# Patient Record
Sex: Female | Born: 1985 | Race: Black or African American | Hispanic: No | Marital: Single | Smoking: Former smoker
Health system: Southern US, Community
[De-identification: ages and names within clinical notes are randomized; demographics above are authoritative.]

## PROBLEM LIST (undated history)

## (undated) DIAGNOSIS — A599 Trichomoniasis, unspecified: Secondary | ICD-10-CM

## (undated) DIAGNOSIS — I10 Essential (primary) hypertension: Secondary | ICD-10-CM

## (undated) DIAGNOSIS — A5400 Gonococcal infection of lower genitourinary tract, unspecified: Secondary | ICD-10-CM

## (undated) DIAGNOSIS — IMO0002 Reserved for concepts with insufficient information to code with codable children: Secondary | ICD-10-CM

## (undated) DIAGNOSIS — N72 Inflammatory disease of cervix uteri: Secondary | ICD-10-CM

## (undated) HISTORY — DX: Reserved for concepts with insufficient information to code with codable children: IMO0002

## (undated) HISTORY — DX: Gonococcal infection of lower genitourinary tract, unspecified: A54.00

## (undated) HISTORY — DX: Inflammatory disease of cervix uteri: N72

---
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
## 2003-02-19 ENCOUNTER — Encounter: Admission: RE | Admit: 2003-02-19 | Discharge: 2003-02-19 | Payer: Self-pay | Admitting: Family Medicine

## 2003-02-19 ENCOUNTER — Ambulatory Visit (HOSPITAL_COMMUNITY): Admission: RE | Admit: 2003-02-19 | Discharge: 2003-02-19 | Payer: Self-pay | Admitting: Sports Medicine

## 2003-03-05 ENCOUNTER — Encounter: Payer: Self-pay | Admitting: Family Medicine

## 2003-03-05 ENCOUNTER — Encounter: Admission: RE | Admit: 2003-03-05 | Discharge: 2003-03-05 | Payer: Self-pay | Admitting: Family Medicine

## 2003-03-05 ENCOUNTER — Other Ambulatory Visit: Admission: RE | Admit: 2003-03-05 | Discharge: 2003-03-05 | Payer: Self-pay | Admitting: Family Medicine

## 2003-06-23 ENCOUNTER — Encounter: Admission: RE | Admit: 2003-06-23 | Discharge: 2003-06-23 | Payer: Self-pay | Admitting: Sports Medicine

## 2003-06-23 ENCOUNTER — Ambulatory Visit (HOSPITAL_COMMUNITY): Admission: RE | Admit: 2003-06-23 | Discharge: 2003-06-23 | Payer: Self-pay | Admitting: Sports Medicine

## 2003-09-10 ENCOUNTER — Encounter: Admission: RE | Admit: 2003-09-10 | Discharge: 2003-09-10 | Payer: Self-pay | Admitting: Family Medicine

## 2003-09-25 ENCOUNTER — Encounter: Admission: RE | Admit: 2003-09-25 | Discharge: 2003-09-25 | Payer: Self-pay | Admitting: Family Medicine

## 2003-11-28 ENCOUNTER — Emergency Department (HOSPITAL_COMMUNITY): Admission: AD | Admit: 2003-11-28 | Discharge: 2003-11-28 | Payer: Self-pay | Admitting: Family Medicine

## 2003-12-04 ENCOUNTER — Emergency Department (HOSPITAL_COMMUNITY): Admission: EM | Admit: 2003-12-04 | Discharge: 2003-12-04 | Payer: Self-pay | Admitting: Internal Medicine

## 2003-12-28 ENCOUNTER — Encounter: Admission: RE | Admit: 2003-12-28 | Discharge: 2003-12-28 | Payer: Self-pay | Admitting: Family Medicine

## 2004-02-25 ENCOUNTER — Encounter: Admission: RE | Admit: 2004-02-25 | Discharge: 2004-02-25 | Payer: Self-pay | Admitting: Family Medicine

## 2004-03-21 ENCOUNTER — Encounter (INDEPENDENT_AMBULATORY_CARE_PROVIDER_SITE_OTHER): Payer: Self-pay | Admitting: *Deleted

## 2004-03-21 LAB — CONVERTED CEMR LAB

## 2004-03-31 ENCOUNTER — Encounter: Payer: Self-pay | Admitting: Family Medicine

## 2004-03-31 ENCOUNTER — Encounter: Admission: RE | Admit: 2004-03-31 | Discharge: 2004-03-31 | Payer: Self-pay | Admitting: Family Medicine

## 2004-04-05 ENCOUNTER — Emergency Department (HOSPITAL_COMMUNITY): Admission: EM | Admit: 2004-04-05 | Discharge: 2004-04-05 | Payer: Self-pay | Admitting: Emergency Medicine

## 2004-06-09 ENCOUNTER — Ambulatory Visit: Payer: Self-pay | Admitting: Family Medicine

## 2004-07-26 ENCOUNTER — Ambulatory Visit: Payer: Self-pay | Admitting: Sports Medicine

## 2004-08-30 ENCOUNTER — Ambulatory Visit: Payer: Self-pay | Admitting: Family Medicine

## 2004-09-15 ENCOUNTER — Ambulatory Visit: Payer: Self-pay | Admitting: Family Medicine

## 2004-11-11 ENCOUNTER — Ambulatory Visit: Payer: Self-pay | Admitting: Sports Medicine

## 2004-11-14 ENCOUNTER — Ambulatory Visit: Payer: Self-pay | Admitting: Family Medicine

## 2005-01-05 ENCOUNTER — Ambulatory Visit: Payer: Self-pay | Admitting: Family Medicine

## 2005-04-06 ENCOUNTER — Ambulatory Visit (HOSPITAL_COMMUNITY): Admission: RE | Admit: 2005-04-06 | Discharge: 2005-04-06 | Payer: Self-pay | Admitting: Family Medicine

## 2005-04-06 ENCOUNTER — Ambulatory Visit: Payer: Self-pay | Admitting: Family Medicine

## 2005-05-16 ENCOUNTER — Emergency Department (HOSPITAL_COMMUNITY): Admission: EM | Admit: 2005-05-16 | Discharge: 2005-05-17 | Payer: Self-pay | Admitting: Emergency Medicine

## 2005-07-15 ENCOUNTER — Emergency Department (HOSPITAL_COMMUNITY): Admission: EM | Admit: 2005-07-15 | Discharge: 2005-07-15 | Payer: Self-pay | Admitting: Emergency Medicine

## 2005-08-07 ENCOUNTER — Ambulatory Visit: Payer: Self-pay | Admitting: Family Medicine

## 2005-09-05 ENCOUNTER — Ambulatory Visit: Payer: Self-pay | Admitting: Family Medicine

## 2005-09-05 DIAGNOSIS — N870 Mild cervical dysplasia: Secondary | ICD-10-CM

## 2005-09-05 HISTORY — DX: Mild cervical dysplasia: N87.0

## 2005-10-23 ENCOUNTER — Ambulatory Visit: Payer: Self-pay | Admitting: Family Medicine

## 2006-02-26 ENCOUNTER — Encounter (INDEPENDENT_AMBULATORY_CARE_PROVIDER_SITE_OTHER): Payer: Self-pay | Admitting: Specialist

## 2006-02-26 ENCOUNTER — Ambulatory Visit: Payer: Self-pay | Admitting: Family Medicine

## 2006-05-03 ENCOUNTER — Ambulatory Visit: Payer: Self-pay | Admitting: Family Medicine

## 2006-05-28 ENCOUNTER — Ambulatory Visit: Payer: Self-pay | Admitting: Family Medicine

## 2006-05-28 ENCOUNTER — Encounter (INDEPENDENT_AMBULATORY_CARE_PROVIDER_SITE_OTHER): Payer: Self-pay | Admitting: *Deleted

## 2006-05-28 ENCOUNTER — Other Ambulatory Visit: Admission: RE | Admit: 2006-05-28 | Discharge: 2006-05-28 | Payer: Self-pay | Admitting: Family Medicine

## 2006-05-28 LAB — CONVERTED CEMR LAB

## 2006-08-31 ENCOUNTER — Ambulatory Visit: Payer: Self-pay | Admitting: Family Medicine

## 2006-10-18 DIAGNOSIS — G44209 Tension-type headache, unspecified, not intractable: Secondary | ICD-10-CM | POA: Insufficient documentation

## 2006-10-18 DIAGNOSIS — G43109 Migraine with aura, not intractable, without status migrainosus: Secondary | ICD-10-CM | POA: Insufficient documentation

## 2006-10-18 DIAGNOSIS — G43909 Migraine, unspecified, not intractable, without status migrainosus: Secondary | ICD-10-CM

## 2006-10-19 ENCOUNTER — Encounter (INDEPENDENT_AMBULATORY_CARE_PROVIDER_SITE_OTHER): Payer: Self-pay | Admitting: *Deleted

## 2007-01-24 ENCOUNTER — Ambulatory Visit: Payer: Self-pay | Admitting: Family Medicine

## 2007-01-24 ENCOUNTER — Encounter (INDEPENDENT_AMBULATORY_CARE_PROVIDER_SITE_OTHER): Payer: Self-pay | Admitting: Family Medicine

## 2007-01-24 ENCOUNTER — Encounter: Payer: Self-pay | Admitting: Family Medicine

## 2007-01-24 DIAGNOSIS — R87610 Atypical squamous cells of undetermined significance on cytologic smear of cervix (ASC-US): Secondary | ICD-10-CM

## 2007-01-24 DIAGNOSIS — A5400 Gonococcal infection of lower genitourinary tract, unspecified: Secondary | ICD-10-CM

## 2007-01-24 HISTORY — DX: Gonococcal infection of lower genitourinary tract, unspecified: A54.00

## 2007-01-24 HISTORY — DX: Atypical squamous cells of undetermined significance on cytologic smear of cervix (ASC-US): R87.610

## 2007-01-24 LAB — CONVERTED CEMR LAB
Antibody Screen: NEGATIVE
Basophils Absolute: 0 10*3/uL (ref 0.0–0.1)
Basophils Relative: 0 % (ref 0–1)
Beta hcg, urine, semiquantitative: POSITIVE
Chlamydia, DNA Probe: NEGATIVE
Eosinophils Absolute: 0 10*3/uL (ref 0.0–0.7)
Eosinophils Relative: 0 % (ref 0–5)
GC Culture Only: NEGATIVE
GC Probe Amp, Genital: NEGATIVE
HCT: 42.5 % (ref 36.0–46.0)
Hemoglobin: 14.4 g/dL (ref 12.0–15.0)
Hepatitis B Surface Ag: NEGATIVE
Lymphocytes Relative: 30 % (ref 12–46)
Lymphs Abs: 1.9 10*3/uL (ref 0.7–3.3)
MCHC: 33.9 g/dL (ref 30.0–36.0)
MCV: 87.4 fL (ref 78.0–100.0)
Monocytes Absolute: 0.8 10*3/uL — ABNORMAL HIGH (ref 0.2–0.7)
Monocytes Relative: 12 % — ABNORMAL HIGH (ref 3–11)
Neutro Abs: 3.7 10*3/uL (ref 1.7–7.7)
Neutrophils Relative %: 58 % (ref 43–77)
Platelets: 258 10*3/uL (ref 150–400)
RBC: 4.86 M/uL (ref 3.87–5.11)
RDW: 13 % (ref 11.5–14.0)
Rh Type: POSITIVE
Rubella: 23.5 intl units/mL — ABNORMAL HIGH
WBC: 6.4 10*3/uL (ref 4.0–10.5)
Whiff Test: POSITIVE

## 2007-01-25 ENCOUNTER — Ambulatory Visit (HOSPITAL_COMMUNITY): Admission: RE | Admit: 2007-01-25 | Discharge: 2007-01-25 | Payer: Self-pay | Admitting: Family Medicine

## 2007-01-29 ENCOUNTER — Telehealth: Payer: Self-pay | Admitting: *Deleted

## 2007-02-01 ENCOUNTER — Encounter: Payer: Self-pay | Admitting: Family Medicine

## 2007-02-12 ENCOUNTER — Ambulatory Visit: Payer: Self-pay | Admitting: Family Medicine

## 2007-02-12 ENCOUNTER — Ambulatory Visit (HOSPITAL_COMMUNITY): Admission: RE | Admit: 2007-02-12 | Discharge: 2007-02-12 | Payer: Self-pay | Admitting: Family Medicine

## 2007-02-12 ENCOUNTER — Telehealth: Payer: Self-pay | Admitting: *Deleted

## 2007-02-12 LAB — CONVERTED CEMR LAB
Bilirubin Urine: NEGATIVE
Blood in Urine, dipstick: NEGATIVE
Glucose, Urine, Semiquant: NEGATIVE
Ketones, urine, test strip: NEGATIVE
Nitrite: NEGATIVE
Protein, U semiquant: NEGATIVE
Specific Gravity, Urine: 1.025
Urobilinogen, UA: 0.2
WBC Urine, dipstick: NEGATIVE
pH: 6

## 2007-02-20 ENCOUNTER — Encounter: Payer: Self-pay | Admitting: Family Medicine

## 2007-02-20 ENCOUNTER — Ambulatory Visit: Payer: Self-pay | Admitting: Family Medicine

## 2007-03-06 ENCOUNTER — Encounter: Payer: Self-pay | Admitting: Family Medicine

## 2007-03-20 ENCOUNTER — Encounter: Payer: Self-pay | Admitting: Family Medicine

## 2007-03-20 ENCOUNTER — Ambulatory Visit: Payer: Self-pay | Admitting: Family Medicine

## 2007-03-21 ENCOUNTER — Inpatient Hospital Stay (HOSPITAL_COMMUNITY): Admission: AD | Admit: 2007-03-21 | Discharge: 2007-03-21 | Payer: Self-pay | Admitting: Obstetrics & Gynecology

## 2007-04-08 ENCOUNTER — Ambulatory Visit: Payer: Self-pay | Admitting: Sports Medicine

## 2007-04-10 ENCOUNTER — Ambulatory Visit: Payer: Self-pay | Admitting: Family Medicine

## 2007-04-17 ENCOUNTER — Ambulatory Visit: Payer: Self-pay | Admitting: Family Medicine

## 2007-04-17 LAB — CONVERTED CEMR LAB
Glucose, Urine, Semiquant: NEGATIVE
Protein, U semiquant: NEGATIVE

## 2007-05-01 ENCOUNTER — Ambulatory Visit (HOSPITAL_COMMUNITY): Admission: RE | Admit: 2007-05-01 | Discharge: 2007-05-01 | Payer: Self-pay | Admitting: Family Medicine

## 2007-05-03 ENCOUNTER — Encounter (INDEPENDENT_AMBULATORY_CARE_PROVIDER_SITE_OTHER): Payer: Self-pay | Admitting: Family Medicine

## 2007-05-21 ENCOUNTER — Ambulatory Visit: Payer: Self-pay | Admitting: Family Medicine

## 2007-05-21 LAB — CONVERTED CEMR LAB
Glucose, Urine, Semiquant: NEGATIVE
Protein, U semiquant: NEGATIVE

## 2007-06-13 ENCOUNTER — Inpatient Hospital Stay (HOSPITAL_COMMUNITY): Admission: AD | Admit: 2007-06-13 | Discharge: 2007-06-14 | Payer: Self-pay | Admitting: Family Medicine

## 2007-06-13 ENCOUNTER — Ambulatory Visit: Payer: Self-pay | Admitting: Obstetrics and Gynecology

## 2007-06-18 ENCOUNTER — Ambulatory Visit: Payer: Self-pay | Admitting: Family Medicine

## 2007-06-18 LAB — CONVERTED CEMR LAB
Glucose, Urine, Semiquant: NEGATIVE
Protein, U semiquant: NEGATIVE

## 2007-06-20 ENCOUNTER — Telehealth (INDEPENDENT_AMBULATORY_CARE_PROVIDER_SITE_OTHER): Payer: Self-pay | Admitting: *Deleted

## 2007-06-21 ENCOUNTER — Ambulatory Visit: Payer: Self-pay | Admitting: Family Medicine

## 2007-07-03 ENCOUNTER — Ambulatory Visit: Payer: Self-pay | Admitting: Family Medicine

## 2007-07-03 LAB — CONVERTED CEMR LAB
GTT, 1 hr: 172 mg/dL
Glucose, Urine, Semiquant: NEGATIVE
Protein, U semiquant: NEGATIVE
Whiff Test: NEGATIVE

## 2007-07-09 ENCOUNTER — Encounter (INDEPENDENT_AMBULATORY_CARE_PROVIDER_SITE_OTHER): Payer: Self-pay | Admitting: Family Medicine

## 2007-07-09 ENCOUNTER — Ambulatory Visit: Payer: Self-pay | Admitting: Family Medicine

## 2007-07-09 LAB — CONVERTED CEMR LAB
HCT: 36.4 % (ref 36.0–46.0)
Hemoglobin: 11.9 g/dL — ABNORMAL LOW (ref 12.0–15.0)
MCHC: 32.7 g/dL (ref 30.0–36.0)
MCV: 91.2 fL (ref 78.0–100.0)
Platelets: 197 10*3/uL (ref 150–400)
RBC: 3.99 M/uL (ref 3.87–5.11)
RDW: 13.2 % (ref 11.5–15.5)
WBC: 9.4 10*3/uL (ref 4.0–10.5)

## 2007-07-10 ENCOUNTER — Telehealth: Payer: Self-pay | Admitting: *Deleted

## 2007-08-02 ENCOUNTER — Ambulatory Visit (HOSPITAL_COMMUNITY): Admission: RE | Admit: 2007-08-02 | Discharge: 2007-08-02 | Payer: Self-pay | Admitting: Family Medicine

## 2007-08-02 ENCOUNTER — Ambulatory Visit: Payer: Self-pay | Admitting: Family Medicine

## 2007-08-02 ENCOUNTER — Encounter (INDEPENDENT_AMBULATORY_CARE_PROVIDER_SITE_OTHER): Payer: Self-pay | Admitting: Family Medicine

## 2007-08-02 LAB — CONVERTED CEMR LAB
Bilirubin Urine: NEGATIVE
Chlamydia, DNA Probe: NEGATIVE
GC Probe Amp, Genital: NEGATIVE
Glucose, Urine, Semiquant: NEGATIVE
Ketones, urine, test strip: NEGATIVE
Nitrite: NEGATIVE
Protein, U semiquant: 30
Specific Gravity, Urine: 1.02
Urobilinogen, UA: 1
Whiff Test: NEGATIVE
pH: 7

## 2007-08-18 ENCOUNTER — Inpatient Hospital Stay (HOSPITAL_COMMUNITY): Admission: AD | Admit: 2007-08-18 | Discharge: 2007-08-18 | Payer: Self-pay | Admitting: Obstetrics & Gynecology

## 2007-08-18 ENCOUNTER — Ambulatory Visit: Payer: Self-pay | Admitting: Obstetrics & Gynecology

## 2007-08-20 ENCOUNTER — Ambulatory Visit: Payer: Self-pay | Admitting: Family Medicine

## 2007-08-20 LAB — CONVERTED CEMR LAB: Glucose, Urine, Semiquant: NEGATIVE

## 2007-08-28 ENCOUNTER — Ambulatory Visit: Payer: Self-pay | Admitting: Family Medicine

## 2007-08-28 LAB — CONVERTED CEMR LAB: Glucose, Urine, Semiquant: NEGATIVE

## 2007-09-03 ENCOUNTER — Inpatient Hospital Stay (HOSPITAL_COMMUNITY): Admission: AD | Admit: 2007-09-03 | Discharge: 2007-09-03 | Payer: Self-pay | Admitting: Family Medicine

## 2007-09-03 ENCOUNTER — Ambulatory Visit: Payer: Self-pay | Admitting: Obstetrics and Gynecology

## 2007-09-03 LAB — CONVERTED CEMR LAB: Strep B culture: NEGATIVE

## 2007-09-04 ENCOUNTER — Encounter: Payer: Self-pay | Admitting: *Deleted

## 2007-09-06 ENCOUNTER — Ambulatory Visit: Payer: Self-pay | Admitting: *Deleted

## 2007-09-06 ENCOUNTER — Encounter: Payer: Self-pay | Admitting: Family Medicine

## 2007-09-06 ENCOUNTER — Ambulatory Visit: Payer: Self-pay | Admitting: Family Medicine

## 2007-09-06 ENCOUNTER — Inpatient Hospital Stay (HOSPITAL_COMMUNITY): Admission: AD | Admit: 2007-09-06 | Discharge: 2007-09-06 | Payer: Self-pay | Admitting: Obstetrics and Gynecology

## 2007-09-06 DIAGNOSIS — IMO0002 Reserved for concepts with insufficient information to code with codable children: Secondary | ICD-10-CM | POA: Insufficient documentation

## 2007-09-06 HISTORY — DX: Reserved for concepts with insufficient information to code with codable children: IMO0002

## 2007-09-06 LAB — CONVERTED CEMR LAB
Glucose, Urine, Semiquant: NEGATIVE
Protein, U semiquant: NEGATIVE

## 2007-09-09 ENCOUNTER — Telehealth (INDEPENDENT_AMBULATORY_CARE_PROVIDER_SITE_OTHER): Payer: Self-pay | Admitting: *Deleted

## 2007-09-10 ENCOUNTER — Ambulatory Visit (HOSPITAL_COMMUNITY): Admission: RE | Admit: 2007-09-10 | Discharge: 2007-09-10 | Payer: Self-pay | Admitting: Family Medicine

## 2007-09-10 ENCOUNTER — Encounter (INDEPENDENT_AMBULATORY_CARE_PROVIDER_SITE_OTHER): Payer: Self-pay | Admitting: Family Medicine

## 2007-09-11 ENCOUNTER — Ambulatory Visit: Payer: Self-pay | Admitting: Family Medicine

## 2007-09-11 LAB — CONVERTED CEMR LAB

## 2007-09-12 ENCOUNTER — Telehealth (INDEPENDENT_AMBULATORY_CARE_PROVIDER_SITE_OTHER): Payer: Self-pay | Admitting: Family Medicine

## 2007-09-12 ENCOUNTER — Ambulatory Visit (HOSPITAL_COMMUNITY): Admission: RE | Admit: 2007-09-12 | Discharge: 2007-09-12 | Payer: Self-pay | Admitting: Family Medicine

## 2007-09-12 ENCOUNTER — Encounter: Payer: Self-pay | Admitting: Family Medicine

## 2007-09-12 ENCOUNTER — Encounter (INDEPENDENT_AMBULATORY_CARE_PROVIDER_SITE_OTHER): Payer: Self-pay | Admitting: Family Medicine

## 2007-09-16 ENCOUNTER — Ambulatory Visit (HOSPITAL_COMMUNITY): Admission: RE | Admit: 2007-09-16 | Discharge: 2007-09-16 | Payer: Self-pay | Admitting: Family Medicine

## 2007-09-18 ENCOUNTER — Ambulatory Visit: Payer: Self-pay | Admitting: Family Medicine

## 2007-09-18 ENCOUNTER — Encounter (INDEPENDENT_AMBULATORY_CARE_PROVIDER_SITE_OTHER): Payer: Self-pay | Admitting: Family Medicine

## 2007-09-18 LAB — CONVERTED CEMR LAB
ALT: <8 units/L (ref 0–35)
AST: 14 units/L (ref 0–37)
Alkaline Phosphatase: 173 units/L — ABNORMAL HIGH (ref 39–117)
BUN: 5 mg/dL — ABNORMAL LOW (ref 6–23)
Basophils Absolute: 0 10*3/uL (ref 0.0–0.1)
Basophils Relative: 0 % (ref 0–1)
Calcium: 8.8 mg/dL (ref 8.4–10.5)
Chloride: 107 meq/L (ref 96–112)
Creatinine, Ser: 0.54 mg/dL (ref 0.40–1.20)
Eosinophils Absolute: 0 10*3/uL (ref 0.0–0.7)
MCHC: 33 g/dL (ref 30.0–36.0)
MCV: 86.8 fL (ref 78.0–100.0)
Monocytes Absolute: 1 10*3/uL (ref 0.1–1.0)
Monocytes Relative: 12 % (ref 3–12)
Neutro Abs: 5.6 10*3/uL (ref 1.7–7.7)
Neutrophils Relative %: 69 % (ref 43–77)
Protein, U semiquant: NEGATIVE
RDW: 13.7 % (ref 11.5–15.5)

## 2007-09-19 ENCOUNTER — Encounter: Payer: Self-pay | Admitting: Family Medicine

## 2007-09-19 ENCOUNTER — Telehealth: Payer: Self-pay | Admitting: *Deleted

## 2007-09-19 ENCOUNTER — Inpatient Hospital Stay (HOSPITAL_COMMUNITY): Admission: AD | Admit: 2007-09-19 | Discharge: 2007-09-19 | Payer: Self-pay | Admitting: Obstetrics and Gynecology

## 2007-09-20 ENCOUNTER — Encounter (INDEPENDENT_AMBULATORY_CARE_PROVIDER_SITE_OTHER): Payer: Self-pay | Admitting: Family Medicine

## 2007-09-20 ENCOUNTER — Ambulatory Visit: Payer: Self-pay | Admitting: Family Medicine

## 2007-09-21 ENCOUNTER — Telehealth (INDEPENDENT_AMBULATORY_CARE_PROVIDER_SITE_OTHER): Payer: Self-pay | Admitting: Family Medicine

## 2007-09-23 ENCOUNTER — Ambulatory Visit: Payer: Self-pay | Admitting: Obstetrics & Gynecology

## 2007-09-23 ENCOUNTER — Encounter: Payer: Self-pay | Admitting: *Deleted

## 2007-09-23 ENCOUNTER — Inpatient Hospital Stay (HOSPITAL_COMMUNITY): Admission: AD | Admit: 2007-09-23 | Discharge: 2007-09-27 | Payer: Self-pay | Admitting: Obstetrics and Gynecology

## 2007-09-23 IMAGING — US US OB FOLLOW-UP
1 series · 14 of 16 positions shown · non-contrast
Comparison: none

OBSTETRICAL ULTRASOUND:
 This ultrasound was performed in The [HOSPITAL], and the [facility] report will be stored to [REDACTED] PACS.

[Series 1: us ob follow-up · 14 of 16 slices shown]
[im 1/16]
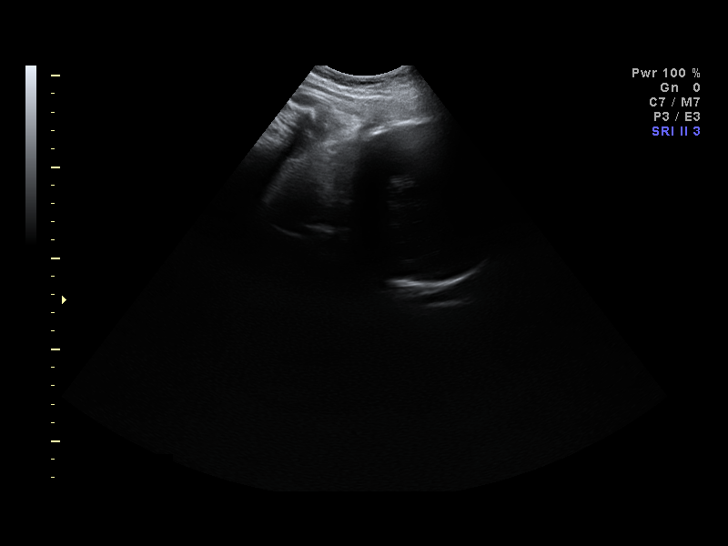
[im 2/16]
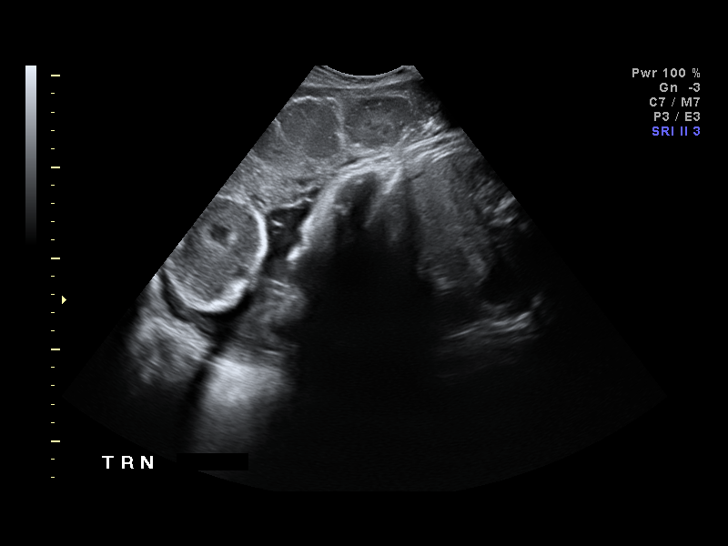
[im 3/16]
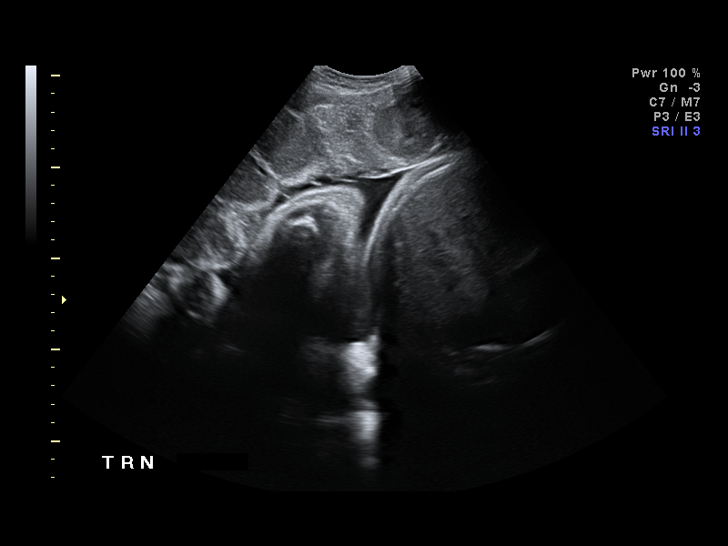
[im 5/16]
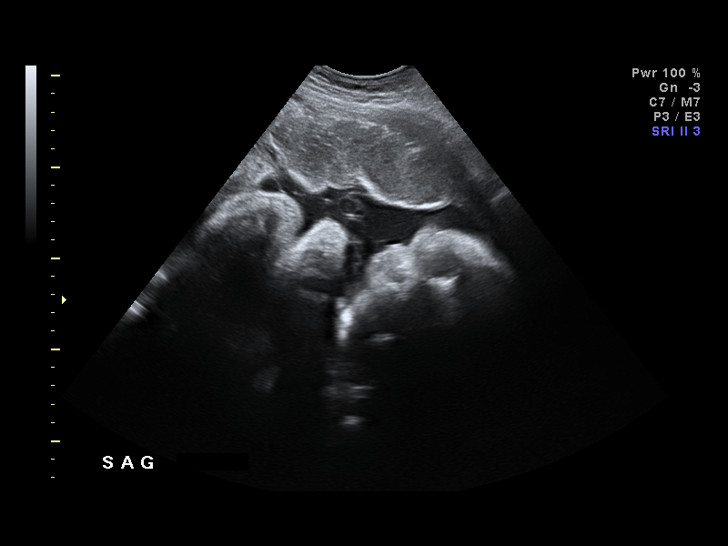
[im 6/16]
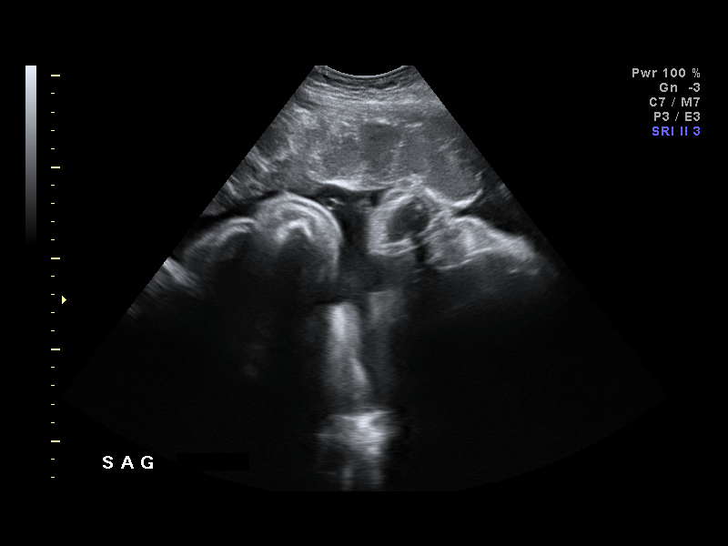
[im 7/16]
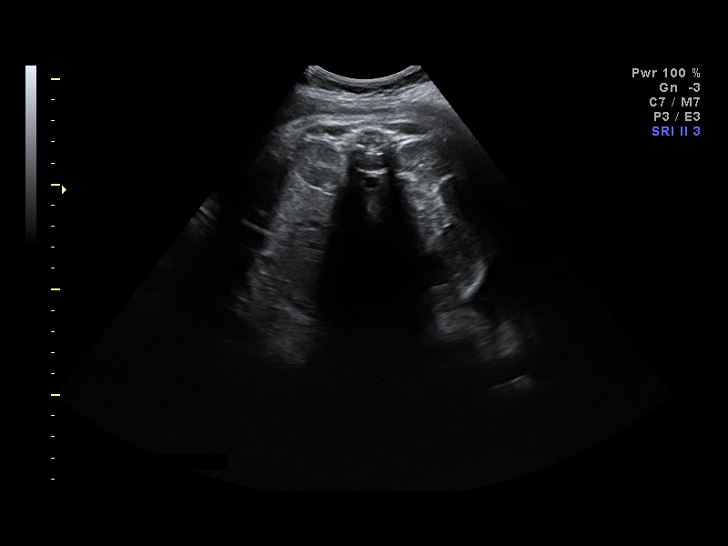
[im 8/16]
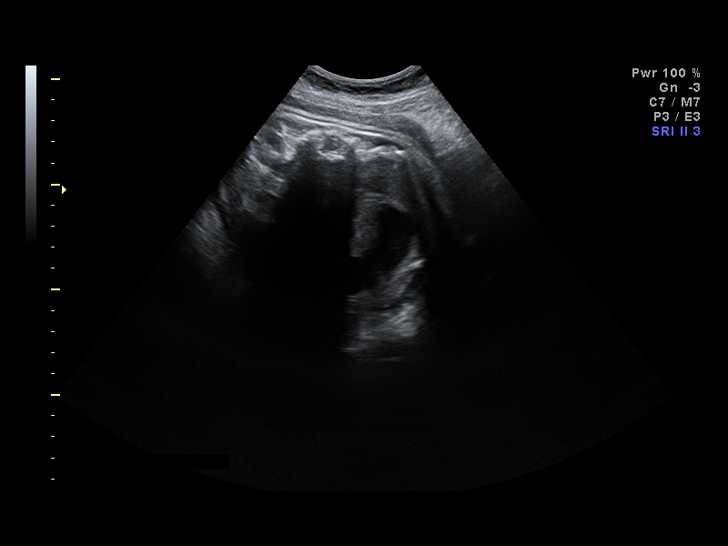
[im 9/16]
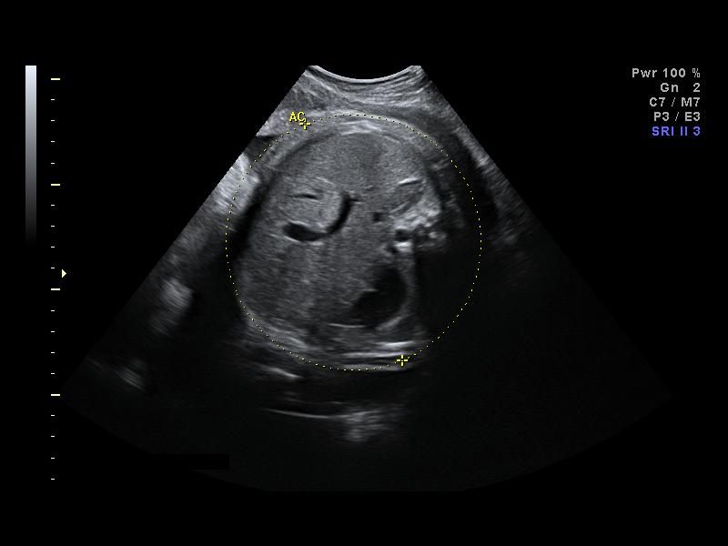
[im 10/16]
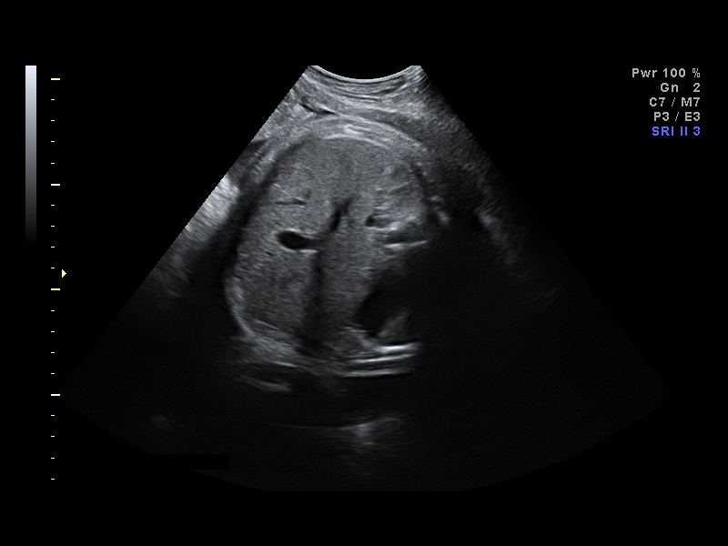
[im 11/16]
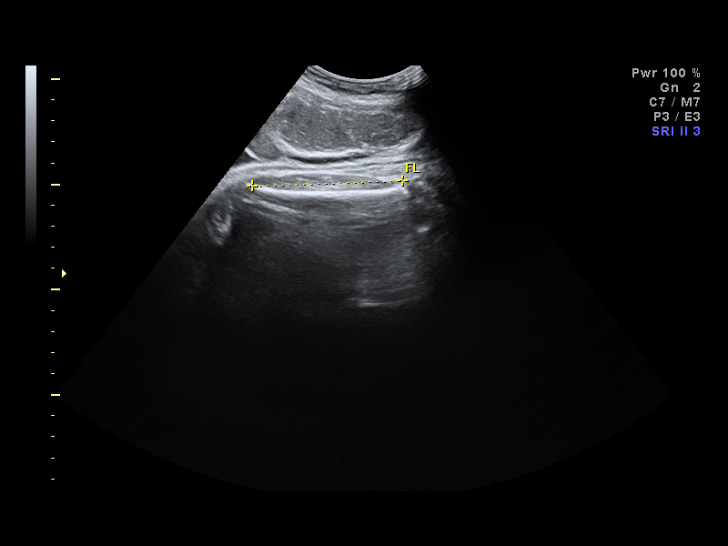
[im 13/16]
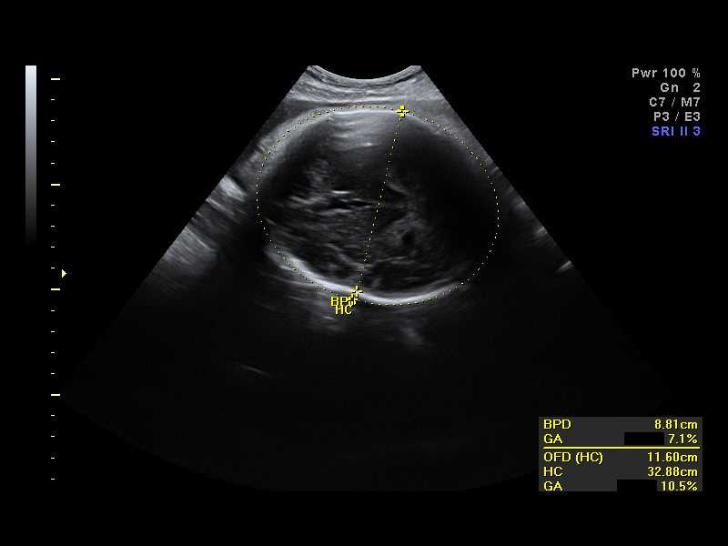
[im 14/16]
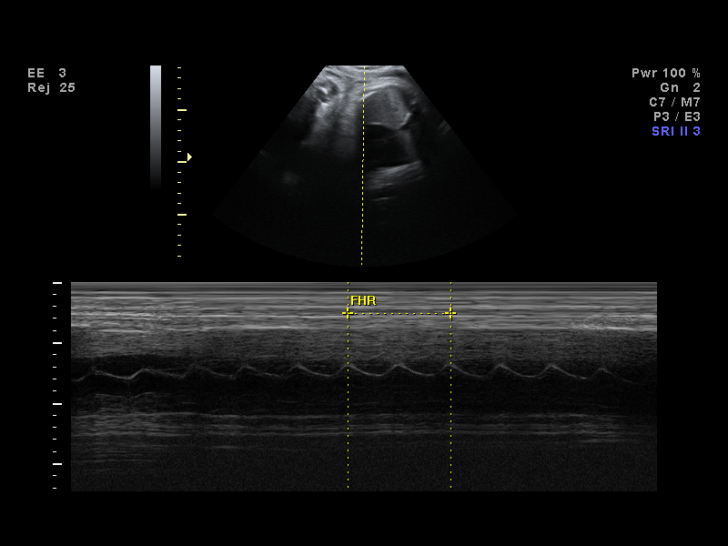
[im 15/16]
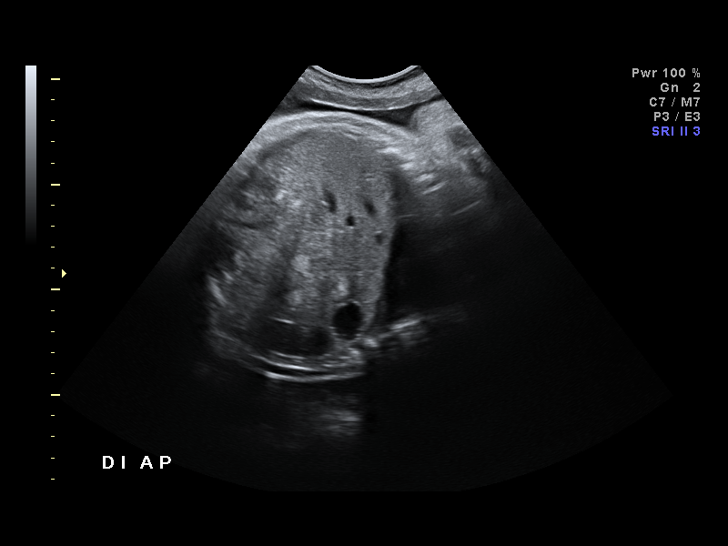
[im 16/16]
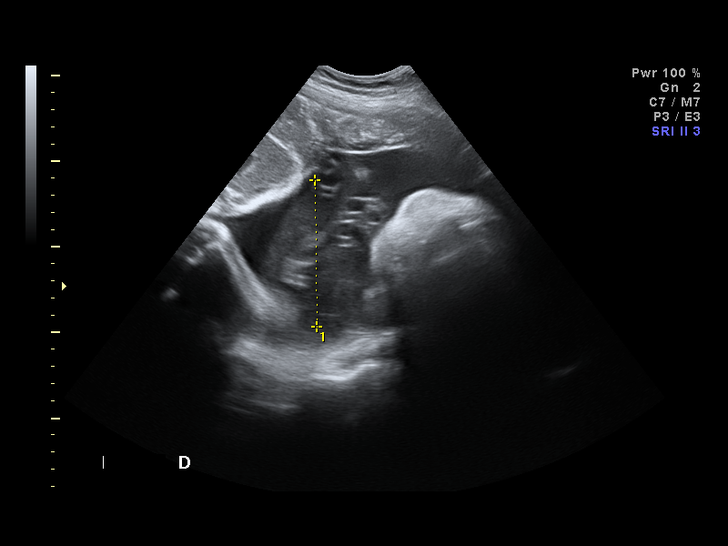

[14 of 16 positions shown; findings below may reference images not displayed]

IMPRESSION: The [facility] report has also been faxed to the ordering physician.

## 2007-09-24 ENCOUNTER — Encounter: Payer: Self-pay | Admitting: Obstetrics & Gynecology

## 2007-09-30 ENCOUNTER — Ambulatory Visit: Payer: Self-pay | Admitting: Family Medicine

## 2007-10-02 ENCOUNTER — Telehealth (INDEPENDENT_AMBULATORY_CARE_PROVIDER_SITE_OTHER): Payer: Self-pay | Admitting: Family Medicine

## 2007-11-08 ENCOUNTER — Encounter (INDEPENDENT_AMBULATORY_CARE_PROVIDER_SITE_OTHER): Payer: Self-pay | Admitting: *Deleted

## 2007-11-08 ENCOUNTER — Ambulatory Visit: Payer: Self-pay | Admitting: Family Medicine

## 2007-11-21 ENCOUNTER — Ambulatory Visit: Payer: Self-pay | Admitting: Family Medicine

## 2007-12-09 ENCOUNTER — Encounter (INDEPENDENT_AMBULATORY_CARE_PROVIDER_SITE_OTHER): Payer: Self-pay | Admitting: *Deleted

## 2008-03-30 ENCOUNTER — Ambulatory Visit (HOSPITAL_COMMUNITY): Admission: RE | Admit: 2008-03-30 | Discharge: 2008-03-30 | Payer: Self-pay | Admitting: Family Medicine

## 2008-03-30 ENCOUNTER — Ambulatory Visit: Payer: Self-pay | Admitting: Family Medicine

## 2008-03-30 LAB — CONVERTED CEMR LAB
Glucose, Urine, Semiquant: NEGATIVE
Protein, U semiquant: NEGATIVE

## 2008-04-01 ENCOUNTER — Encounter: Payer: Self-pay | Admitting: Family Medicine

## 2008-07-01 ENCOUNTER — Encounter: Payer: Self-pay | Admitting: Family Medicine

## 2008-07-01 ENCOUNTER — Telehealth: Payer: Self-pay | Admitting: *Deleted

## 2008-07-01 ENCOUNTER — Ambulatory Visit: Payer: Self-pay | Admitting: Family Medicine

## 2008-07-01 LAB — CONVERTED CEMR LAB: Rapid Strep: POSITIVE

## 2008-11-30 ENCOUNTER — Other Ambulatory Visit: Admission: RE | Admit: 2008-11-30 | Discharge: 2008-11-30 | Payer: Self-pay | Admitting: Family Medicine

## 2008-11-30 ENCOUNTER — Ambulatory Visit: Payer: Self-pay | Admitting: Family Medicine

## 2008-11-30 ENCOUNTER — Encounter: Payer: Self-pay | Admitting: Family Medicine

## 2008-11-30 DIAGNOSIS — N72 Inflammatory disease of cervix uteri: Secondary | ICD-10-CM

## 2008-11-30 DIAGNOSIS — A5901 Trichomonal vulvovaginitis: Secondary | ICD-10-CM

## 2008-11-30 HISTORY — DX: Inflammatory disease of cervix uteri: N72

## 2008-11-30 LAB — CONVERTED CEMR LAB
Beta hcg, urine, semiquantitative: NEGATIVE
Whiff Test: NEGATIVE

## 2008-12-01 ENCOUNTER — Encounter: Payer: Self-pay | Admitting: Family Medicine

## 2008-12-01 LAB — CONVERTED CEMR LAB: GC Probe Amp, Genital: NEGATIVE

## 2008-12-02 ENCOUNTER — Encounter: Payer: Self-pay | Admitting: Family Medicine

## 2008-12-03 ENCOUNTER — Telehealth: Payer: Self-pay | Admitting: Family Medicine

## 2009-02-01 ENCOUNTER — Ambulatory Visit: Payer: Self-pay | Admitting: Family Medicine

## 2009-02-01 DIAGNOSIS — F39 Unspecified mood [affective] disorder: Secondary | ICD-10-CM | POA: Insufficient documentation

## 2009-02-01 HISTORY — DX: Unspecified mood (affective) disorder: F39

## 2009-02-01 LAB — CONVERTED CEMR LAB
Beta hcg, urine, semiquantitative: NEGATIVE
Whiff Test: POSITIVE

## 2009-02-03 ENCOUNTER — Encounter: Payer: Self-pay | Admitting: Family Medicine

## 2009-02-10 ENCOUNTER — Ambulatory Visit: Payer: Self-pay | Admitting: Family Medicine

## 2009-02-12 ENCOUNTER — Encounter (INDEPENDENT_AMBULATORY_CARE_PROVIDER_SITE_OTHER): Payer: Self-pay | Admitting: *Deleted

## 2009-02-12 ENCOUNTER — Ambulatory Visit: Payer: Self-pay | Admitting: Family Medicine

## 2009-08-02 ENCOUNTER — Encounter: Payer: Self-pay | Admitting: *Deleted

## 2009-08-02 ENCOUNTER — Encounter: Payer: Self-pay | Admitting: Family Medicine

## 2009-08-02 ENCOUNTER — Ambulatory Visit: Payer: Self-pay | Admitting: Family Medicine

## 2009-08-02 LAB — CONVERTED CEMR LAB: Chlamydia, Swab/Urine, PCR: NEGATIVE

## 2009-08-03 ENCOUNTER — Encounter: Payer: Self-pay | Admitting: Family Medicine

## 2009-08-23 ENCOUNTER — Encounter (INDEPENDENT_AMBULATORY_CARE_PROVIDER_SITE_OTHER): Payer: Self-pay | Admitting: *Deleted

## 2009-08-23 DIAGNOSIS — F17200 Nicotine dependence, unspecified, uncomplicated: Secondary | ICD-10-CM

## 2009-08-23 DIAGNOSIS — Z9189 Other specified personal risk factors, not elsewhere classified: Secondary | ICD-10-CM | POA: Insufficient documentation

## 2009-08-23 HISTORY — DX: Other specified personal risk factors, not elsewhere classified: Z91.89

## 2009-11-24 ENCOUNTER — Telehealth: Payer: Self-pay | Admitting: Family Medicine

## 2009-12-21 ENCOUNTER — Encounter: Payer: Self-pay | Admitting: *Deleted

## 2010-01-25 ENCOUNTER — Emergency Department (HOSPITAL_COMMUNITY): Admission: EM | Admit: 2010-01-25 | Discharge: 2010-01-25 | Payer: Self-pay | Admitting: Family Medicine

## 2010-02-02 ENCOUNTER — Ambulatory Visit: Payer: Self-pay | Admitting: Family Medicine

## 2010-02-02 DIAGNOSIS — I10 Essential (primary) hypertension: Secondary | ICD-10-CM | POA: Insufficient documentation

## 2010-02-02 DIAGNOSIS — R030 Elevated blood-pressure reading, without diagnosis of hypertension: Secondary | ICD-10-CM

## 2010-09-11 ENCOUNTER — Encounter: Payer: Self-pay | Admitting: Family Medicine

## 2010-09-20 NOTE — Assessment & Plan Note (Signed)
Summary: elevated BP,df   Vital Signs:  Patient profile:   25 year old female Height:      62.5 inches Weight:      141 pounds BMI:     25.47 Temp:     98.1 degrees F oral Pulse rate:   74 / minute BP sitting:   113 / 77  (left arm) Cuff size:   regular  Vitals Entered By: Tessie Fass CMA (February 02, 2010 4:39 PM)  Serial Vital Signs/Assessments:  Time      Position  BP       Pulse  Resp  Temp     By           R Arm     128/82                         Eustaquio Boyden  MD  CC: elevated BP Is Patient Diabetic? No Pain Assessment Patient in pain? yes     Location: head Intensity: 7   Primary Care Provider:  TODD MCDIARMID MD  CC:  elevated BP.  History of Present Illness: CC: check blood pressure  Seen last week at [facility] with sinusitis and found to have elevated BP to 189/111.  told to f/u at PCP's office.  Had not recently eating anything, drank, smoked, exercised, stressed.  + pain from sinus congestion.  + elevated bp since having daughter 2 years ago.  Was on HCTZ shortly after daughter born for presumed pregnancy induced HTN.  + BP meds during delivery.  + pre-eclampsia with daughter - IOL early at 37 wks 2/2 this.   + smoking 2-3 cigarettes/day  + HA with aura (h/o migraines years ago).  no chest pain, tightness, urinary changes, LE swelling.    was on depo, no longer.  Habits & Providers  Alcohol-Tobacco-Diet     Tobacco Status: current     Tobacco Counseling: to quit use of tobacco products     Cigarette Packs/Day: <0.25  Current Medications (verified): 1)  None  Allergies: 1)  Prilosec (Omeprazole) 2)  Ortho-Novum 1/35 (28) (Norethindrone-Eth Estradiol)  Past History:  Past Medical History: Last updated: 02/01/2009 Hx of chicken pox as child  MIGRAINE without Aura  Secondary Amenorrhea w/u WNL for Prolactin, Free testostosterone,TSH,FSH,DHEA-S  - 03/12/2003   GC and Chlamydia Positive 05/01- Tx`d GC and Chlamydia Positive 05/05 - Tx`d    Chlamydia PID Tx'd 01/06  GC/Chlamydia DNA probe negative 12/06 Chalmydia Cervicitis 04/10 - Tx'd  HIV neg, RPR-neg, HBVsAb neg 5/05 - 01/07/2004, 03/12/2003 HIV neg, RPR neg 04/10 Gardisil Vaccination series of three injections completed 01/08  Amblyopia right eye, mild  Yeast Vaginitis 08/05  CIN-1 (Jan /2007): Normal & Adequate Pap smear Jan 2009.  History of Pregnancy-induced Hypertension in 2008-09  Family History: Family History Lung cancer (Father died in Jan 07, 2003) Family History Hypertension: Mother No MI, CVA in mom's side  Doesn't know dad's side history  Social History: Packs/Day:  <0.25  Physical Exam  General:  Well-developed,well-nourished,in no acute distress; alert,appropriate and cooperative throughout examination Lungs:  Normal respiratory effort, chest expands symmetrically. Lungs are clear to auscultation, no crackles or wheezes. Heart:  Normal rate and regular rhythm. S1 and S2 normal without gallop, murmur, click, rub or other extra sounds. Extremities:  No edema   Impression & Recommendations:  Problem # 1:  ELEVATED BP READING WITHOUT DX HYPERTENSION (ICD-796.2)  previous elevated BPs at office x 2-3 times, also with h/o  PIH.  Since then has had normal readings, today's reading is normal.  Advised to keep log of BPs for next few weeks with weekly readings at local pharmacy, given red HTN log to bring back at next visit.  Will obtain more data points, then decide on medication.  If decide to start med, would start HCTZ as seems to have tolerated well in past OR pregnancy -safe med such as labetalol (NOt currently on birht control).  Also discussed lifestyle changes to help with BP control.  (pt instructions)  BP today: 113/77 Prior BP: 138/95 (08/02/2009)  Prior 10 Yr Risk Heart Disease: Not enough information (11/21/2007)  Labs Reviewed: Creat: 0.54 (09/18/2007)  Orders: [facility]- Est Level  3 ([id number])  Problem # 2:  TOBACCO USER (ICD-305.1) encouraged  cessation. Orders: [facility]- Est Level  3 ([id number])  Patient Instructions: 1)  Return in 2-3 weeks for follow up. 2)  Your blood pressure is looking ok today. 3)  Take blood pressure log and keep track at local pharmacy 3 times a week for 2 weeks, bring log at next visit. 4)  Then we will decide on blood pressure medicines. 5)  In the meantime, healthy things to do for blood pressure are: 1. quit smoking, 2. more fruits and vegetables (high potassium diet), 3. less fried greasy salty foods (low sodium diet).

## 2010-09-20 NOTE — Miscellaneous (Signed)
Summary: Tobacco Catherine Martin  Clinical Lists Changes  Problems: Added new problem of TOBACCO [name] (ICD-305.1) 

## 2010-09-20 NOTE — Progress Notes (Signed)
Summary: triage  Phone Note Call from Patient Call back at [phone]   Caller: Patient Summary of Call: Pt got depo and is bleeding a little is this normal. Initial call taken by: Clydell Hakim,  November 24, 2009 3:06 PM  Follow-up for Phone Call        the phone number has been dc'd. will wait for her to call back Follow-up by: Golden Circle RN,  November 24, 2009 3:13 PM  Additional Follow-up for Phone Call Additional follow up Details #1::        has been on depo x 1 yr for excess bleeding. missed recent shots due to lack of insurance. has been 6 months since last one.has brownish discharge now.  told her her cycles are likely beginning again. wants to go back on Depo. states she will have her family planning medicaid sometime next week. appt with pcp on Mon the 25th for cpp. she is not having sex due to fear of pregnancy Additional Follow-up by: Golden Circle RN,  November 25, 2009 9:00 AM    Additional Follow-up for Phone Call Additional follow up Details #2::    Noted. Follow-up by: Tawanna Cooler Rune Mendez MD,  November 26, 2009 8:00 AM

## 2010-09-20 NOTE — Letter (Signed)
Summary: Probation Letter  [facility]  [address]   [place], [place] [address]   Phone: [phone]  Fax: [phone]    12/21/2009  [name] [address] [place], [place]  [address]  Dear [name],  With the goal of better serving all our patients the [facility] is following each patient's missed appointments.  You have missed at least 3 appointments with our practice.If you cannot keep your appointment, we expect you to call at least 24 hours before your appointment time.  Missing appointments prevents other patients from seeing [place] and makes it difficult to provide you with the best possible medical care.      1.   If you miss one more appointment, we will only give you limited medical services. This means we will not call in medication refills, complete a form, or make a referral for you except when you are here for a scheduled office visit.    2.   If you miss 2 or more appointments in the next year, we will dismiss you from our practice.    Our office staff can be reached at [phone] Monday through Friday from 8:30 a.m.-5:00 p.m. and will be glad to schedule your appointment as necessary.    Thank you.   [facility]  Appended Document: Probation Letter cert mailed  Appended Document: Probation Letter Letter returned unable to forward.

## 2011-01-03 NOTE — Discharge Summary (Signed)
NAMEMARIADEJESUS, CADE               ACCOUNT NO.:  [id number]   MEDICAL RECORD NO.:  [id number]          PATIENT TYPE:  INP   LOCATION:  9111                          FACILITY:  [facility]   PHYSICIAN:  Phil D. Okey Dupre, M.D.     DATE OF BIRTH:  03/25/1986   DATE OF ADMISSION:  09/23/2007  DATE OF DISCHARGE:  09/27/2007                               DISCHARGE SUMMARY   REASON FOR ADMISSION:  Induction of labor.   PROCEDURES PRENATAL:  Nonstress test and ultrasound.   PROCEDURES INTRAPARTUM:  Cesarean, low cervical transverse.   PROCEDURES POSTPARTUM:  None.   COMPLICATIONS OPERATIVE AND POSTPARTUM:  None.   DISCHARGE DIAGNOSES:  1. Term pregnancy, delivered.  2. Pregnancy-induced hypertension.   BRIEF HOSPITAL COURSE:  The patient is a 25 year old G1, P0 who  presented at 75 and five-sevenths weeks for induction secondary to  pregnancy-induced hypertension comorbid with late-onset gestational  diabetes.  The patient was admitted and induction was started with  Cytotec.  The patient progressed well on Cytotec and was transitioned to  Pitocin following attainment of a favorable cervix..  The patient  labored under Pitocin with appropriate progression until 8 cm of  cervical dilation.  At that time cervical rate of change declined.  Intrauterine pressure catheter was placed to ensure adequate MVUs and  Pitocin was titrated accordingly.  Subsequently, electronic fetal  monitoring showed decreased variability and a concern over impending  nonreassuring signs was raised.  At this time the patient had complete  cervical dilation and had been pushing for more than an hour.  Decision  was made to take the patient to the OR for surgical delivery.  Delivery  was made via low transverse cervical incision without complications.  A  healthy infant girl was delivered with Apgars 7 and 9.  Infant was  suctioned at the abdomen and handed to the NICU team.  Routine  postoperative closure was performed with  good hemostasis.  Both infant  and mother were stable.  Please see operative note for additional  details.  The patient was discharged home on postpartum day #2.  She was  ambulating freely and was without pain complaint.  Of note, during her  postpartum stay following admission to the ICU after delivery for blood  pressure monitoring and discontinuation of magnesium sulfate infusion  that was started during induction the patient was noted to have  persistent blood pressures as high as 154/100.  The patient was given  labetalol with good blood pressure response and transitioned to  hydrochlorothiazide  with close postpartum followup.  The patient was scheduled with her  primary physician, Dr. Towana Badger at the [facility], on February 9 at 8:30 a.m. for a repeat blood pressure check and  staple removal.  The patient was given Depo-Provera for contraception.  She was rubella immune, hemoglobin 10.3.      Towana Badger, M.D.      Phil D. Okey Dupre, M.D.  Electronically Signed    JP/MEDQ  D:  09/27/2007  T:  09/28/2007  Job:  [id number]

## 2011-01-03 NOTE — Op Note (Signed)
NAMEDELL, HURTUBISE               ACCOUNT NO.:  [id number]   MEDICAL RECORD NO.:  [id number]          PATIENT TYPE:  INP   LOCATION:                                FACILITY:  [facility]   PHYSICIAN:  Norton Blizzard, MD    DATE OF BIRTH:  1985/11/27   DATE OF PROCEDURE:  09/24/2007  DATE OF DISCHARGE:  09/27/2007                               OPERATIVE REPORT   PREOPERATIVE DIAGNOSES:  1. Intrauterine pregnancy at 38 weeks 6 days.  2. Failure to progress.  3. Nonreactive fetal heart tracing with early decelerations.  4. Pregnancy induced hypertension.  5. Gestational diabetes.  6. Group B strep negative.   POSTOPERATIVE DIAGNOSES:  1. Intrauterine pregnancy at 38 weeks 6 days.  2. Failure to progress.  3. Nonreactive fetal heart tracing with early decelerations.  4. Pregnancy induced hypertension.  5. Gestational diabetes.  6. Group B strep negative.   OPERATION PERFORMED:  Primary low transverse cesarean section.   SURGEON:  Norton Blizzard, MD   ASSISTANT:  Karlton Lemon, MD   ANESTHESIA:  Epidural.   FINDINGS:  1. Viable female infant weighing 7 pounds 12 ounces with Apgars of 7      at one minute and 9 at five minutes and a cord pH of 7.22.  2. Clear amniotic fluid.  3. Normal female pelvic anatomy.   ESTIMATED BLOOD LOSS:  700 mL.   DRAINS:  Foley catheter with 350 mL of reddish colored urine.   FLUIDS REPLACED:  2000 mL of lactated Ringers.   COMPLICATIONS:  None immediate.   SPECIMENS:  Placenta to labor and delivery.  Cord pH to the lab, cord  gas to the cord bank.   INDICATIONS FOR PROCEDURE:  Ms. Keylah Darwish is a 25 year old gravida  1, para 0 at 38 weeks 6 days that was induction of labor for pregnancy  induced hypertension.  She also had gestational diabetes mellitus.  She  had been started on magnesium.  She had progressed while on Pitocin  and  pushed for over an hour and a half with minimal progress from -1 station  to 0 station.  There was also of  note fetal heart tones with a flat  baseline of 135 without good variability and early decelerations.  There  was good scalp stimulation, good fetal heart tracings.   DESCRIPTION OF PROCEDURE:  The patient was taken to the operating room  and was prepped and draped in the usual sterile manner in supine  position with left lateral uterine displacement.  After assuring  adequate epidural anesthesia, a Pfannenstiel skin incision was made  using a scalpel.  The incision was carried down through the subcutaneous  tissues using scalpel.  The rectus fascia was nicked in the midline and  incision was extended laterally in each direction using Mayo scissors.  The rectus muscle was dissected free of the fascia using both sharp and  blunt dissection.  The rectus muscles were separated bluntly and with  Mayo scissors.  The parietal peritoneum was identified and entered  bluntly under direct visualization.  Opening of the  parietal peritoneum  was extended using Metzenbaum scissors and Bovie cautery.  The bladder  blade was placed and the reflection of the visceral peritoneum superior  to the bladder was identified and elevated and incised using the  Metzenbaum scissors.  The incision was extended laterally.  The bladder  flap was created using blunt dissection and retracted using a bladder  blade.  A low transverse uterine incision was made using the scalpel and  the incision was extended laterally and superiorly using blunt  dissection.  Hand was placed within the uterine cavity and the head was  found to be low in the pelvis.  There was some difficulty elevating the  infant's head and it was in the right occiput anterior position.  The  infant's head was rotated in good position and flexed and delivered  without difficulty.  The body of the infant was then delivered.  The  infant was bulb suctioned after delivery.  The cord was doubly clamped  and cut and the infant handed to the nursery team in  attendance.  Specimen was collected for cord pH and came back 7.22.  Placenta was  delivered with uterine massage and cord traction.  The endometrial  cavity was then wiped free of any uterine membranes using wet laparotomy  sponges.  The edges of the uterine incision were grasped with ring  clamps and the uterine incision was closed in two layers, the first  being a running locking stitch of 0-0 Monocryl and the second being a  running horizontal imbricating stitch of 0-0 Monocryl.  Due to gross  hematuria in the Foley bag the bladder was backfilled with methylene  blue without any evidence of extravasation of the fluid from the  bladder.  The uterine incision was then inspected and found to have  adequate hemostasis.  The rectus muscles were inspected and small areas  of bleeding were controlled using Bovie cautery.  The rectus muscles  were reapproximated at the inferior edge with three stitches of  interrupted 2-0 chromic.  Small areas of bleeding in the subcutaneous  tissue were controlled using Bovie cautery.  The rectus fascia was then  reapproximated with one suture of 0-0 Vicryl.  The skin was  reapproximated with staples.  Sponge, needle and instrument counts were  correct x3.  The patient tolerated the procedure well and went to  recovery in stable condition.      Karlton Lemon, MD      Norton Blizzard, MD  Electronically Signed    NS/MEDQ  D:  09/24/2007  T:  09/25/2007  Job:  [id number]

## 2011-01-31 ENCOUNTER — Inpatient Hospital Stay (INDEPENDENT_AMBULATORY_CARE_PROVIDER_SITE_OTHER)
Admission: RE | Admit: 2011-01-31 | Discharge: 2011-01-31 | Disposition: A | Discharge status: Home / Self Care | Payer: Self-pay | Source: Ambulatory Visit | Attending: Family Medicine | Admitting: Family Medicine

## 2011-01-31 DIAGNOSIS — N949 Unspecified condition associated with female genital organs and menstrual cycle: Secondary | ICD-10-CM

## 2011-01-31 LAB — POCT URINALYSIS DIP (DEVICE)
Glucose, UA: NEGATIVE mg/dL
Nitrite: NEGATIVE
Protein, ur: NEGATIVE mg/dL
Urobilinogen, UA: 1 mg/dL (ref 0.0–1.0)

## 2011-01-31 LAB — WET PREP, GENITAL: Yeast Wet Prep HPF POC: NONE SEEN

## 2011-01-31 LAB — POCT PREGNANCY, URINE: Preg Test, Ur: NEGATIVE

## 2011-05-11 LAB — CBC
HCT: 30.8 — ABNORMAL LOW
Hemoglobin: 10.9 — ABNORMAL LOW
Hemoglobin: 12.4
MCHC: 35.4
MCV: 85.2
MCV: 84.2
RBC: 3.61 — ABNORMAL LOW
RBC: 3.84 — ABNORMAL LOW
RBC: 4.2
RDW: 13.6
WBC: 10.5

## 2011-05-11 LAB — COMPREHENSIVE METABOLIC PANEL
ALT: <8
Albumin: 2.4 — ABNORMAL LOW
Alkaline Phosphatase: 116
Alkaline Phosphatase: 179 — ABNORMAL HIGH
BUN: 3 — ABNORMAL LOW
CO2: 22
CO2: 23
CO2: 24
Calcium: 8.6
Chloride: 104
Chloride: 107
Creatinine, Ser: 0.58
Creatinine, Ser: 0.57
GFR calc Af Amer: >60
GFR calc non Af Amer: >60
GFR calc non Af Amer: >60
GFR calc non Af Amer: >60
Glucose, Bld: 127 — ABNORMAL HIGH
Glucose, Bld: 119 — ABNORMAL HIGH
Glucose, Bld: 90
Potassium: 3 — ABNORMAL LOW
Potassium: 4.3
Sodium: 137
Total Bilirubin: 0.5

## 2011-05-11 LAB — CREATININE CLEARANCE, URINE, 24 HOUR
Creatinine Clearance: 157 — ABNORMAL HIGH
Creatinine, Urine: 87.3
Creatinine: 0.58

## 2011-05-11 LAB — URINALYSIS, ROUTINE W REFLEX MICROSCOPIC
Bilirubin Urine: NEGATIVE
Glucose, UA: NEGATIVE
Hgb urine dipstick: NEGATIVE
Ketones, ur: NEGATIVE
Specific Gravity, Urine: 1.02
Urobilinogen, UA: 2 — ABNORMAL HIGH
pH: 7.5

## 2011-05-11 LAB — URINE MICROSCOPIC-ADD ON

## 2011-05-11 LAB — URIC ACID
Uric Acid, Serum: 5
Uric Acid, Serum: 5.2

## 2011-05-11 LAB — LACTATE DEHYDROGENASE: LDH: 147

## 2011-05-11 LAB — PROTEIN, URINE, 24 HOUR: Protein, 24H Urine: 210 — ABNORMAL HIGH

## 2011-05-11 LAB — RPR: RPR Ser Ql: NONREACTIVE

## 2011-05-11 LAB — GC/CHLAMYDIA PROBE AMP, GENITAL: Chlamydia, DNA Probe: NEGATIVE

## 2011-05-12 LAB — URINALYSIS, ROUTINE W REFLEX MICROSCOPIC
Nitrite: NEGATIVE
Specific Gravity, Urine: 1.01
Urobilinogen, UA: 0.2
pH: 6.5

## 2011-05-12 LAB — URIC ACID: Uric Acid, Serum: 6.5

## 2011-05-12 LAB — COMPREHENSIVE METABOLIC PANEL
ALT: <8
AST: 18
AST: 23
Albumin: 2.6 — ABNORMAL LOW
Alkaline Phosphatase: 129 — ABNORMAL HIGH
CO2: 21
Calcium: 8.6
Chloride: 102
Creatinine, Ser: 0.55
GFR calc Af Amer: >60
GFR calc Af Amer: >60
GFR calc non Af Amer: 55 — ABNORMAL LOW
Glucose, Bld: 112 — ABNORMAL HIGH
Potassium: 3.5
Sodium: 131 — ABNORMAL LOW
Total Bilirubin: 0.8
Total Protein: 6.2

## 2011-05-12 LAB — POCT URINALYSIS DIP (DEVICE)
Operator id: 297281
Protein, ur: NEGATIVE
Specific Gravity, Urine: 1.01
Urobilinogen, UA: 0.2

## 2011-05-12 LAB — CBC
Hemoglobin: 10.3 — ABNORMAL LOW
Hemoglobin: 11.3 — ABNORMAL LOW
MCHC: 35.2
MCV: 85.1
Platelets: 181
Platelets: 181
RBC: 3.37 — ABNORMAL LOW
RDW: 14
RDW: 14.3
WBC: 12.7 — ABNORMAL HIGH
WBC: 9.9

## 2011-05-12 LAB — URINE MICROSCOPIC-ADD ON

## 2011-05-12 LAB — LACTATE DEHYDROGENASE: LDH: 233

## 2011-05-12 LAB — MAGNESIUM
Magnesium: 5.4 — ABNORMAL HIGH
Magnesium: 6.1

## 2011-05-31 LAB — URINALYSIS, ROUTINE W REFLEX MICROSCOPIC
Nitrite: NEGATIVE
Specific Gravity, Urine: 1.015
Urobilinogen, UA: 0.2
pH: 7

## 2011-05-31 LAB — COMPREHENSIVE METABOLIC PANEL
Albumin: 3 — ABNORMAL LOW
Alkaline Phosphatase: 50
BUN: 6
Chloride: 105
Creatinine, Ser: 0.53
Glucose, Bld: 103 — ABNORMAL HIGH
Potassium: 4.1
Total Bilirubin: 0.6

## 2011-05-31 LAB — CBC
HCT: 32.1 — ABNORMAL LOW
Hemoglobin: 11.4 — ABNORMAL LOW
MCV: 88.9
Platelets: 245
RDW: 12.3
WBC: 10.6 — ABNORMAL HIGH

## 2011-05-31 LAB — FETAL FIBRONECTIN: Fetal Fibronectin: NEGATIVE

## 2011-05-31 LAB — GC/CHLAMYDIA PROBE AMP, GENITAL
Chlamydia, DNA Probe: NEGATIVE
GC Probe Amp, Genital: NEGATIVE

## 2011-05-31 LAB — WET PREP, GENITAL: Yeast Wet Prep HPF POC: NONE SEEN

## 2011-05-31 LAB — DIFFERENTIAL
Basophils Absolute: 0
Basophils Relative: 0
Lymphocytes Relative: 20
Monocytes Absolute: 1.3 — ABNORMAL HIGH
Neutro Abs: 7
Neutrophils Relative %: 66

## 2011-05-31 LAB — URINE MICROSCOPIC-ADD ON

## 2011-06-05 LAB — CBC
MCV: 85.4
Platelets: 259
RBC: 4.2
WBC: 5.1

## 2011-06-05 LAB — ABO/RH: ABO/RH(D): A POS

## 2011-09-18 ENCOUNTER — Inpatient Hospital Stay (HOSPITAL_COMMUNITY)
Admission: AD | Admit: 2011-09-18 | Discharge: 2011-09-18 | Disposition: A | Discharge status: Home / Self Care | Payer: Self-pay | Source: Ambulatory Visit | Attending: Obstetrics & Gynecology | Admitting: Obstetrics & Gynecology

## 2011-09-18 ENCOUNTER — Encounter (HOSPITAL_COMMUNITY): Payer: Self-pay | Admitting: *Deleted

## 2011-09-18 ENCOUNTER — Emergency Department (INDEPENDENT_AMBULATORY_CARE_PROVIDER_SITE_OTHER)
Admission: EM | Admit: 2011-09-18 | Discharge: 2011-09-18 | Disposition: A | Discharge status: Home / Self Care | Payer: Self-pay | Source: Home / Self Care

## 2011-09-18 ENCOUNTER — Inpatient Hospital Stay (HOSPITAL_COMMUNITY): Payer: Self-pay

## 2011-09-18 ENCOUNTER — Encounter (HOSPITAL_COMMUNITY): Payer: Self-pay

## 2011-09-18 ENCOUNTER — Emergency Department (INDEPENDENT_AMBULATORY_CARE_PROVIDER_SITE_OTHER): Payer: Self-pay

## 2011-09-18 DIAGNOSIS — R1032 Left lower quadrant pain: Secondary | ICD-10-CM | POA: Insufficient documentation

## 2011-09-18 DIAGNOSIS — R102 Pelvic and perineal pain: Secondary | ICD-10-CM

## 2011-09-18 DIAGNOSIS — N760 Acute vaginitis: Secondary | ICD-10-CM | POA: Insufficient documentation

## 2011-09-18 DIAGNOSIS — N949 Unspecified condition associated with female genital organs and menstrual cycle: Secondary | ICD-10-CM

## 2011-09-18 DIAGNOSIS — A499 Bacterial infection, unspecified: Secondary | ICD-10-CM

## 2011-09-18 DIAGNOSIS — B9689 Other specified bacterial agents as the cause of diseases classified elsewhere: Secondary | ICD-10-CM | POA: Insufficient documentation

## 2011-09-18 DIAGNOSIS — R109 Unspecified abdominal pain: Secondary | ICD-10-CM

## 2011-09-18 HISTORY — DX: Essential (primary) hypertension: I10

## 2011-09-18 HISTORY — DX: Trichomoniasis, unspecified: A59.9

## 2011-09-18 LAB — WET PREP, GENITAL: Trich, Wet Prep: NONE SEEN

## 2011-09-18 LAB — POCT URINALYSIS DIP (DEVICE)
Glucose, UA: NEGATIVE mg/dL
Ketones, ur: NEGATIVE mg/dL
Leukocytes, UA: NEGATIVE
Protein, ur: NEGATIVE mg/dL
Urobilinogen, UA: 1 mg/dL (ref 0.0–1.0)

## 2011-09-18 LAB — POCT PREGNANCY, URINE: Preg Test, Ur: NEGATIVE

## 2011-09-18 MED ORDER — METRONIDAZOLE 500 MG PO TABS: 500.0000 mg | ORAL_TABLET | Freq: Two times a day (BID) | ORAL | Status: AC

## 2011-09-18 MED ORDER — KETOROLAC TROMETHAMINE 60 MG/2ML IM SOLN
60.0000 mg | Freq: Once | INTRAMUSCULAR | Status: AC
  Administered 2011-09-18: 60 mg via INTRAMUSCULAR
  Filled 2011-09-18: qty 2

## 2011-09-18 NOTE — Progress Notes (Signed)
Abdominal pain, cramping, pressure X 3 days. Went to urgent care. Negative UPT, had pelvic and cultures, then referred to MAU for further evaluation.

## 2011-09-18 NOTE — ED Provider Notes (Signed)
History     CSN: [id number]  Arrival date & time 09/18/11  1312   None     Chief Complaint  Patient presents with  . Abdominal Pain    HPI Catherine Martin is a 26 y.o. female who presents to [facility] for low abdominal pain. The pain started 3 days ago. Denies UTI symptoms, fever or chills. Had mild nausea this am.  She was evaluated at [facility] today and sent to [facility] for further evaluation. While at Urgent care she had a negative pregnancy test and a pelvic exam that showed left adnexal tenderness. GC and Chlamydia cultures were obtained and wet prep showed many clue cells. Abdominal x-ray was normal.   Past Medical History  Diagnosis Date  . Hypertension   . Trichomonas     Past Surgical History  Procedure Date  . Cesarean section     Family History  Problem Relation Age of Onset  . Anesthesia problems Neg Hx     History  Substance Use Topics  . Smoking status: Never Smoker   . Smokeless tobacco: Never Used  . Alcohol Use: No    OB History    Grav Para Term Preterm Abortions TAB SAB Ect Mult Living   1 1 1  0 0 0 0 0 0 1      Review of Systems  Constitutional: Negative for fever, chills, diaphoresis and fatigue.  HENT: Negative for ear pain, congestion, sore throat, facial swelling, neck pain, neck stiffness, dental problem and sinus pressure.   Eyes: Negative for photophobia, pain and discharge.  Respiratory: Negative for cough, chest tightness and wheezing.   Gastrointestinal: Positive for nausea and abdominal pain. Negative for vomiting, diarrhea, constipation and abdominal distention.  Genitourinary: Positive for vaginal discharge and pelvic pain. Negative for dysuria, frequency, flank pain, decreased urine volume, vaginal bleeding and difficulty urinating.  Musculoskeletal: Negative for myalgias, back pain and gait problem.  Skin: Negative for color change and rash.  Neurological: Negative for dizziness, speech difficulty, weakness, light-headedness, numbness and  headaches.  Psychiatric/Behavioral: Negative for confusion and agitation. The patient is not nervous/anxious.     Allergies  Omeprazole  Home Medications  No current outpatient prescriptions on file.  BP 135/92  Pulse 88  Temp(Src) 98.4 F (36.9 C) (Oral)  Resp 18  Ht 5\' 3"  (1.6 m)  LMP 07/03/2011  Physical Exam  Nursing note and vitals reviewed. Constitutional: She is oriented to person, place, and time. She appears well-developed and well-nourished. No distress.  HENT:  Head: Normocephalic.  Eyes: EOM are normal.  Neck: Neck supple.  Pulmonary/Chest: Effort normal.  Abdominal: Soft. Bowel sounds are normal. There is tenderness in the left lower quadrant. There is rebound and guarding. There is no rigidity and no CVA tenderness.  Musculoskeletal: Normal range of motion.  Neurological: She is alert and oriented to person, place, and time. No cranial nerve deficit.  Skin: Skin is warm and dry.  Psychiatric: She has a normal mood and affect. Her behavior is normal. Judgment and thought content normal.    ED Course  Procedures (including critical care time)  Labs Reviewed - No data to display Dg Abd 1 View  09/18/2011  *RADIOLOGY REPORT*  Clinical Data: Lower abdominal pain and pressure with nausea.  ABDOMEN - 1 VIEW  Comparison: None.  Findings: Gas and stool are scattered in the colon.  No small bowel dilatation.  No unexpected radiopaque calculi.  IMPRESSION: No acute findings.  Original Report Authenticated By: Reyes Ivan,  M.D.   Results for orders placed during the hospital encounter of 09/18/11 (from the past 24 hour(s))  POCT URINALYSIS DIP (DEVICE)     Status: Abnormal   Collection Time   09/18/11 11:22 AM      Component Value Range   Glucose, UA NEGATIVE  NEGATIVE (mg/dL)   Bilirubin Urine NEGATIVE  NEGATIVE    Ketones, ur NEGATIVE  NEGATIVE (mg/dL)   Specific Gravity, Urine 1.020  1.005 - 1.030    Hgb urine dipstick NEGATIVE  NEGATIVE    pH 8.5 (*) 5.0 -  8.0    Protein, ur NEGATIVE  NEGATIVE (mg/dL)   Urobilinogen, UA 1.0  0.0 - 1.0 (mg/dL)   Nitrite NEGATIVE  NEGATIVE    Leukocytes, UA NEGATIVE  NEGATIVE   POCT PREGNANCY, URINE     Status: Normal   Collection Time   09/18/11 11:28 AM      Component Value Range   Preg Test, Ur NEGATIVE  NEGATIVE   WET PREP, GENITAL     Status: Abnormal   Collection Time   09/18/11 12:41 PM      Component Value Range   Yeast, Wet Prep NONE SEEN  NONE SEEN    Trich, Wet Prep NONE SEEN  NONE SEEN    Clue Cells, Wet Prep MODERATE (*) NONE SEEN    WBC, Wet Prep HPF POC FEW (*) NONE SEEN    Dg Abd 1 View  09/18/2011  *RADIOLOGY REPORT*  Clinical Data: Lower abdominal pain and pressure with nausea.  ABDOMEN - 1 VIEW  Comparison: None.  Findings: Gas and stool are scattered in the colon.  No small bowel dilatation.  No unexpected radiopaque calculi.  IMPRESSION: No acute findings.  Original Report Authenticated By: Reyes Ivan, M.D.   US Transvaginal Non-ob  09/18/2011  *RADIOLOGY REPORT*  Clinical Data: Left lower quadrant pelvic pain  TRANSABDOMINAL AND TRANSVAGINAL ULTRASOUND OF PELVIS Technique:  Both transabdominal and transvaginal ultrasound examinations of the pelvis were performed. Transabdominal technique was performed for global imaging of the pelvis including uterus, ovaries, adnexal regions, and pelvic cul-de-sac.  Comparison: None.   It was necessary to proceed with endovaginal exam following the transabdominal exam to visualize the left ovary and endometrium, not seen transabdominally.  Findings:  Uterus: 9.1 x 4.7 x 3.7 cm.  Anteverted, retroflexed.  This renders visualization of the fundus suboptimal.  No focal abnormality.  Endometrium: 11 mm.  Uniformly thin and echogenic.  Right ovary:  4.3 x 2.8 x 2.3 cm.  2.9 x 2.9 x 2.8 cm cyst with minimal internal echoes noted.  Left ovary: 3.0 x 2.6 x 1.5 cm.  Normal.  Other findings: No free fluid  IMPRESSION: No etiology to explain the history of  left-sided pelvic pain. Right ovarian cyst, most likely physiologic, amenable to follow-up in 6-8 weeks during the week following the patient's normal menses.  Original Report Authenticated By: Harrel Lemon, M.D.   US Pelvis Complete  09/18/2011  *RADIOLOGY REPORT*  Clinical Data: Left lower quadrant pelvic pain  TRANSABDOMINAL AND TRANSVAGINAL ULTRASOUND OF PELVIS Technique:  Both transabdominal and transvaginal ultrasound examinations of the pelvis were performed. Transabdominal technique was performed for global imaging of the pelvis including uterus, ovaries, adnexal regions, and pelvic cul-de-sac.  Comparison: None.   It was necessary to proceed with endovaginal exam following the transabdominal exam to visualize the left ovary and endometrium, not seen transabdominally.  Findings:  Uterus: 9.1 x 4.7 x 3.7 cm.  Anteverted, retroflexed.  This renders visualization of the fundus suboptimal.  No focal abnormality.  Endometrium: 11 mm.  Uniformly thin and echogenic.  Right ovary:  4.3 x 2.8 x 2.3 cm.  2.9 x 2.9 x 2.8 cm cyst with minimal internal echoes noted.  Left ovary: 3.0 x 2.6 x 1.5 cm.  Normal.  Other findings: No free fluid  IMPRESSION: No etiology to explain the history of left-sided pelvic pain. Right ovarian cyst, most likely physiologic, amenable to follow-up in 6-8 weeks during the week following the patient's normal menses.  Original Report Authenticated By: Harrel Lemon, M.D.   Assessment:  Abdominal pain LLQ   Bacterial Vaginosis  Plan:  Toradol 60 mg IM   Rx Flagyl  Re evaluation: After toradol the patient states the pain is much better. On exam her abdomen is soft and minimal tenderness in the LLQ, without rebound or guarding.  Plan: I discussed in detail with the patient that we would treat the BV and she is to follow up with her doctor in the [facility]. If her pain returns and she has persistent vomiting, fever or other problems she is to go to the [facility] ED for  further evaluation.   MDM          Kerrie Buffalo, NP 09/18/11 1524

## 2011-09-18 NOTE — ED Provider Notes (Signed)
History     CSN: [id number]  Arrival date & time 09/18/11  0932   None     Chief Complaint  Patient presents with  . Abdominal Pain    (Consider location/radiation/quality/duration/timing/severity/associated sxs/prior treatment) HPI Comments: Onset of Lt lower abdominal pain 3 days ago.Feels like "pressure at the bottom of my stomach."  Pain worsened yesterday morning and she awoke from a sleep feeling like she needed to have a bowel movement. When she went to the bathroom she only passed some gas but felt a little better. Pain awoke her from sleep again at 3 am today. She again tried to have a BM but could not. She went back to sleep, and awoke at 6 or 7 am and then had a normal BM but pain continues. She denies fever, dysuria, urinary frequency, or vaginal discharge. Her LMP was mid November and she is usually regular.    Past Medical History  Diagnosis Date  . Hypertension     History reviewed. No pertinent past surgical history.  History reviewed. No pertinent family history.  History  Substance Use Topics  . Smoking status: Never Smoker   . Smokeless tobacco: Not on file  . Alcohol Use: No    OB History    Grav Para Term Preterm Abortions TAB SAB Ect Mult Living                  Review of Systems  Constitutional: Negative for fever, chills and appetite change.  Respiratory: Negative for shortness of breath.   Cardiovascular: Negative for chest pain.  Gastrointestinal: Positive for abdominal pain and constipation. Negative for nausea, vomiting, diarrhea and abdominal distention.  Genitourinary: Negative for dysuria, frequency, decreased urine volume, vaginal bleeding, vaginal discharge and vaginal pain.    Allergies  Omeprazole  Home Medications  No current outpatient prescriptions on file.  LMP 07/03/2011  Physical Exam  Nursing note and vitals reviewed. Constitutional: She appears well-developed and well-nourished. No distress.  HENT:  Head:  Normocephalic and atraumatic.  Cardiovascular: Normal rate, regular rhythm and normal heart sounds.   Pulmonary/Chest: Effort normal and breath sounds normal. No respiratory distress.  Abdominal: Soft. Bowel sounds are normal. She exhibits no distension and no mass. There is tenderness in the left lower quadrant. There is no rigidity, no rebound and no guarding.  Genitourinary: Uterus normal. There is no rash, tenderness or lesion on the right labia. There is no rash or lesion on the left labia. Cervix exhibits no motion tenderness, no discharge and no friability. Right adnexum displays no mass, no tenderness and no fullness. Left adnexum displays tenderness (with guarding). Left adnexum displays no mass and no fullness. No erythema, tenderness or bleeding around the vagina. No foreign body around the vagina. No signs of injury around the vagina. Vaginal discharge found.    ED Course  Procedures (including critical care time)  Labs Reviewed  POCT URINALYSIS DIP (DEVICE) - Abnormal; Notable for the following:    pH 8.5 (*)    All other components within normal limits  POCT PREGNANCY, URINE  POCT URINALYSIS DIPSTICK  POCT PREGNANCY, URINE  GC/CHLAMYDIA PROBE AMP, GENITAL  WET PREP, GENITAL   Dg Abd 1 View  09/18/2011  *RADIOLOGY REPORT*  Clinical Data: Lower abdominal pain and pressure with nausea.  ABDOMEN - 1 VIEW  Comparison: None.  Findings: Gas and stool are scattered in the colon.  No small bowel dilatation.  No unexpected radiopaque calculi.  IMPRESSION: No acute findings.  Original Report  Authenticated By: Reyes Ivan, M.D.     1. Pelvic pain in female       MDM  Lt pelvic pain. Pt transferred by private vehicle to [facility]. Misty Stanley NP notified.         Melody Comas, [place] 09/18/11 1254

## 2011-09-18 NOTE — ED Notes (Signed)
C/o lower abdominal cramping that started yesterday.  States today the pain is radiating to her legs.  Denies urinary sx, denies vaginal discharge, n/v or diarrhea.  States last normal BM was this am.

## 2011-09-19 LAB — GC/CHLAMYDIA PROBE AMP, GENITAL: GC Probe Amp, Genital: NEGATIVE

## 2011-09-21 NOTE — ED Notes (Signed)
GC/Chlamydia neg., Wet prep: Mod. clue cells, few WBC's.  Pt. adequately treated with Flagyl. Vassie Moselle 09/21/2011

## 2011-09-22 NOTE — ED Provider Notes (Signed)
Medical screening examination/treatment/procedure(s) were performed by non-physician practitioner and as supervising physician I was immediately available for consultation/collaboration.  Corrie Mckusick, MD 09/22/11 1452

## 2011-11-06 ENCOUNTER — Encounter (HOSPITAL_COMMUNITY): Payer: Self-pay

## 2011-11-06 ENCOUNTER — Emergency Department (HOSPITAL_COMMUNITY)
Admission: EM | Admit: 2011-11-06 | Discharge: 2011-11-06 | Disposition: A | Discharge status: Home / Self Care | Payer: Self-pay | Source: Home / Self Care | Attending: Emergency Medicine | Admitting: Emergency Medicine

## 2011-11-06 DIAGNOSIS — J111 Influenza due to unidentified influenza virus with other respiratory manifestations: Secondary | ICD-10-CM

## 2011-11-06 DIAGNOSIS — J209 Acute bronchitis, unspecified: Secondary | ICD-10-CM

## 2011-11-06 MED ORDER — AZITHROMYCIN 250 MG PO TABS: ORAL_TABLET | ORAL | Status: AC

## 2011-11-06 MED ORDER — TRAMADOL HCL 50 MG PO TABS: 100.0000 mg | ORAL_TABLET | Freq: Three times a day (TID) | ORAL | Status: AC | PRN

## 2011-11-06 MED ORDER — BENZONATATE 200 MG PO CAPS: 200.0000 mg | ORAL_CAPSULE | Freq: Three times a day (TID) | ORAL | Status: AC | PRN

## 2011-11-06 NOTE — Discharge Instructions (Signed)
Influenza Facts Flu (influenza) is a contagious respiratory illness caused by the influenza viruses. It can cause mild to severe illness. While most healthy people recover from the flu without specific treatment and without complications, older people, young children, and people with certain health conditions are at higher risk for serious complications from the flu, including death. CAUSES   The flu virus is spread from person to person by respiratory droplets from coughing and sneezing.   A person can also become infected by touching an object or surface with a virus on it and then touching their mouth, eye or nose.   Adults may be able to infect others from 1 day before symptoms occur and up to 7 days after getting sick. So it is possible to give someone the flu even before you know you are sick and continue to infect others while you are sick.  SYMPTOMS   Fever (usually high).   Headache.   Tiredness (can be extreme).   Cough.   Sore throat.   Runny or stuffy nose.   Body aches.   Diarrhea and vomiting may also occur, particularly in children.   These symptoms are referred to as "flu-like symptoms". A lot of different illnesses, including the common cold, can have similar symptoms.  DIAGNOSIS   There are tests that can determine if you have the flu as long you are tested within the first 2 or 3 days of illness.   A doctor's exam and additional tests may be needed to identify if you have a disease that is a complicating the flu.  RISKS AND COMPLICATIONS  Some of the complications caused by the flu include:  Bacterial pneumonia or progressive pneumonia caused by the flu virus.   Loss of body fluids (dehydration).   Worsening of chronic medical conditions, such as heart failure, asthma, or diabetes.   Sinus problems and ear infections.  HOME CARE INSTRUCTIONS   Seek medical care early on.   If you are at high risk from complications of the flu, consult your health-care  provider as soon as you develop flu-like symptoms. Those at high risk for complications include:   People 65 years or older.   People with chronic medical conditions, including diabetes.   Pregnant women.   Young children.   Your caregiver may recommend use of an antiviral medication to help treat the flu.   If you get the flu, get plenty of rest, drink a lot of liquids, and avoid using alcohol and tobacco.   You can take over-the-counter medications to relieve the symptoms of the flu if your caregiver approves. (Never give aspirin to children or teenagers who have flu-like symptoms, particularly fever).  PREVENTION  The single best way to prevent the flu is to get a flu vaccine each fall. Other measures that can help protect against the flu are:  Antiviral Medications   A number of antiviral drugs are approved for use in preventing the flu. These are prescription medications, and a doctor should be consulted before they are used.   Habits for Good Health   Cover your nose and mouth with a tissue when you cough or sneeze, throw the tissue away after you use it.   Wash your hands often with soap and water, especially after you cough or sneeze. If you are not near water, use an alcohol-based hand cleaner.   Avoid people who are sick.   If you get the flu, stay home from work or school. Avoid contact with   other people so that you do not make them sick, too.   Try not to touch your eyes, nose, or mouth as germs ore often spread this way.  IN CHILDREN, EMERGENCY WARNING SIGNS THAT NEED URGENT MEDICAL ATTENTION:  Fast breathing or trouble breathing.   Bluish skin color.   Not drinking enough fluids.   Not waking up or not interacting.   Being so irritable that the child does not want to be held.   Flu-like symptoms improve but then return with fever and worse cough.   Fever with a rash.  IN ADULTS, EMERGENCY WARNING SIGNS THAT NEED URGENT MEDICAL ATTENTION:  Difficulty  breathing or shortness of breath.   Pain or pressure in the chest or abdomen.   Sudden dizziness.   Confusion.   Severe or persistent vomiting.  SEEK IMMEDIATE MEDICAL CARE IF:  You or someone you know is experiencing any of the symptoms above. When you arrive at the emergency center,report that you think you have the flu. You may be asked to wear a mask and/or sit in a secluded area to protect others from getting sick. MAKE SURE YOU:   Understand these instructions.   Monitor your condition.   Seek medical care if you are getting worse, or not improving.  Document Released: 08/10/2003 Document Revised: 07/27/2011 Document Reviewed: 05/06/2009 [employer] Patient Information 2012 [employer].   Most upper respiratory infections are caused by viruses and do not require antibiotics.  We try to save the antibiotics for when we really need them to avoid resistance.  This does not mean that there is nothing that can be done.  Here are a few hints about things that can be done at home to get over an upper respiratory infection quicker:  Get extra sleep and extra fluids.  Get 7 to 9 hours of sleep per night and 6 to 8 glasses of water a day.  Getting extra sleep keeps the immune system from getting run down.  Most people with an upper respiratory infection are a little dehydrated.  The extra fluids also keep the secretions liquified and easier to deal with.  Also, get extra vitamin C.  4000 mg per day is the recommended dose. For the aches, headache, and fever, acetaminophen or ibuprofen are helpful.  These can be alternated every 4 hours.  People with liver disease should avoid large amounts of acetaminophen, and people with ulcer disease, gastroesophageal reflux, gastritis, congestive heart failure, chronic kidney disease, coronary artery disease and the elderly should avoid ibuprofen. For nasal congestion try Mucinex-D, or if you're having lots of sneezing or copious clear nasal drainage  Allegra-D-24 hour.  A Saline nasal spray such as Ocean Spray can also help as can decongestant sprays such as Afrin, but you should not use the decongestant sprays for more than 3 or 4 days since they can be habituating.  If nasal dryness is a problem, Ayr Nasal Gel can help moisturize your nasal passages.  Breath Rite nasal strips can also offer a non-drug alternative treatment to nasal congestion, especially at night. For people with symptoms of sinusitis, sleeping with your head elevated can be helpful.  For sinus pain, moist, hot compresses to the face may provide some relief.  Many people find that inhaling steam as in a shower or from a pot of steaming water can help. For sore throat, zinc containing lozenges such as Cold-Eze or Zicam are helpful.  Zinc helps to fight infection and has a mild astringent effect that   relieves the sore, achey throat.  Hot salt water gargles (8 oz of hot water, 1/2 tsp of table salt, and a pinch of baking soda) can give relief as well as hot beverages such as hot tea. For the cough, old time remedies such as honey or honey and lemon are tried and true.  Over the counter cough syrups such as Delsym 2 tsp every 12 hours can help as well.  It's important when you have an upper respiratory infection not to pass the infection to others.  This involves being very careful about the following:  Frequent hand washing or use of hand sanitizer, especially after coughing, sneezing, blowing your nose or touching your face, nose or eyes. Do not shake hands or touch anyone and try to avoid touching surfaces that other people use such as doorknobs, shopping carts, telephones and computer keyboards. Use tissues and dispose of them properly in a garbage can or ziplock bag. Cough into your sleeve. Do not let others eat or drink after you.  It's also important to recognize the signs of serious illness and get evaluated if they occur: Any respiratory infection that lasts more than 7 to  10 days.  Yellow nasal drainage and sputum are not reliable indicators of a bacterial infection, but if they last for more than 1 week, see your doctor. Fever and sore throat can indicate strep. Fever and cough can indicate influenza or pneumonia. Any kind of severe symptom such as difficulty breathing, intractable vomiting, or severe pain should prompt you to see a doctor as soon as possible.   Your body's immune system is really the thing that will get rid of this infection.  Your immune system is comprised of 2 types of specialized cells called T cells and B cells.  T cells coordinate the array of cells in your body that engulf invading bacteria or viruses while B cells orchestrate the production of antibodies that neutralize infection.  Anything we do or any medications we give you, will just strengthen your immune system or help it clear up the infection quicker.  Here are a few helpful hints to improve your immune system to help overcome this illness or to prevent future infections:  A few vitamins can improve the health of your immune system.  That's why your diet should include plenty of fruits, vegetables, fish, nuts, and whole grains.  Vitamin A and bet-carotene can increase the cells that fight infections (T cells and B cells).  Vitamin A is abundant in dark greens and orange vegetables such as spinach, greens, sweet potatoes, and carrots.  Vitamin B6 contributes to the maturation of white blood cells, the cells that fight disease.  Foods with vitamin B6 include cold cereal and bananas.  Vitamin C is credited with preventing colds because it increases white blood cells and also prevents cellular damage.  Citrus fruits, peaches and green and red bell peppers are all hight in vitamin C.  Vitamin E is an anti-oxidant that encourages the production of natural killer cells which reject foreign invaders and B cells that produce antibodies.  Foods high in vitamin E include wheat germ, nuts and  seeds.  Foods high in omega-3 fatty acids found in foods like salmon, tuna and mackerel boost your immune system and help cells to engulf and absorb germs.  Probiotics are good bacteria that increase your T cells.  These can be found in yogurt and are available in supplements such as Culturelle or Align.  Moderate exercise increases the   strength of your immune system and your ability to recover from illness.  I suggest 3 to 5 moderate intensity 30 minute workouts per week.    Sleep is another component of maintaining a strong immune system.  It enables your body to recuperate from the day's activities, stress and work.  My recommendation is to get between 7 and 9 hours of sleep per night.  If you smoke, try to quit completely or at least cut down.  Drink alcohol only in moderation if at all.  No more than 2 drinks daily for men or 1 for women.  Get a flu vaccine early in the fall or if you have not gotten one yet, once this illness has run its course.  If you are over 65, a smoker, or an asthmatic, get a pneumococcal vaccine.  My final recommendation is to maintain a healthy weight.  Excess weight can impair the immune system by interfering with the way the immune system deals with invading viruses or bacteria.   

## 2011-11-06 NOTE — ED Provider Notes (Signed)
Chief Complaint  Patient presents with  . Influenza    History of Present Illness:   The patient is a 26 year old female who has had a 5 day history of chills, fever up to 103, myalgias, headache, nasal congestion, rhinorrhea, read drainage, ear congestion, cough which is sometimes dry sometimes productive green sputum, chest tightness, chest pain, and crampy abdominal pain. She has not had a sore throat. She works in [employer] and has been exposed to many children who have been sick with a similar illness. She has not had a flu vaccine this year.  Review of Systems:  Other than noted above, the patient denies any of the following symptoms. Systemic:  No fever, chills, sweats, fatigue, myalgias, headache, or anorexia. Eye:  No redness, pain or drainage. ENT:  No earache, nasal congestion, rhinorrhea, sinus pressure, or sore throat. Lungs:  No cough, sputum production, wheezing, shortness of breath. Or chest pain. GI:  No nausea, vomiting, abdominal pain or diarrhea. Skin:  No rash or itching.  PMFSH:  Past medical history, family history, social history, meds, and allergies were reviewed.  Physical Exam:   Vital signs:  BP 127/89  Pulse 92  Temp(Src) 99.4 F (37.4 C) (Oral)  Resp 16  SpO2 98%  LMP 09/27/2011 General:  Alert, in no distress. Eye:  No conjunctival injection or drainage. ENT:  TMs and canals were normal, without erythema or inflammation.  Nasal mucosa was clear and uncongested, without drainage.  Mucous membranes were moist.  Pharynx was clear, without exudate or drainage.  There were no oral ulcerations or lesions. Neck:  Supple, no adenopathy, tenderness or mass. Lungs:  No respiratory distress.  Lungs were clear to auscultation, without wheezes, rales or rhonchi.  Breath sounds were clear and equal bilaterally. Heart:  Regular rhythm, without gallops, murmers or rubs. Skin:  Clear, warm, and dry, without rash or lesions.  Labs:   Results for orders placed during the  hospital encounter of 11/06/11  POCT PREGNANCY, URINE      Component Value Range   Preg Test, Ur NEGATIVE  NEGATIVE     Assessment:   Diagnoses that have been ruled out:  None  Diagnoses that are still under consideration:  None  Final diagnoses:  Influenza-like illness  Acute bronchitis      Plan:   1.  The following meds were prescribed:   New Prescriptions   AZITHROMYCIN (ZITHROMAX Z-PAK) 250 MG TABLET    Take as directed.   BENZONATATE (TESSALON) 200 MG CAPSULE    Take 1 capsule (200 mg total) by mouth 3 (three) times daily as needed for cough.   TRAMADOL (ULTRAM) 50 MG TABLET    Take 2 tablets (100 mg total) by mouth every 8 (eight) hours as needed for pain.   2.  The patient was instructed in symptomatic care and handouts were given. 3.  The patient was told to return if becoming worse in any way, if no better in 3 or 4 days, and given some red flag symptoms that would indicate earlier return.  Reuben Likes, MD 11/06/11 1302

## 2011-11-06 NOTE — ED Notes (Signed)
Works in day care; has been having URI type syx since Friday, green tinted secretions

## 2012-03-04 ENCOUNTER — Encounter (HOSPITAL_COMMUNITY): Payer: Self-pay | Admitting: Emergency Medicine

## 2012-03-04 ENCOUNTER — Emergency Department (HOSPITAL_COMMUNITY)
Admission: EM | Admit: 2012-03-04 | Discharge: 2012-03-04 | Disposition: A | Discharge status: Home / Self Care | Payer: Self-pay | Attending: Emergency Medicine | Admitting: Emergency Medicine

## 2012-03-04 ENCOUNTER — Emergency Department (HOSPITAL_COMMUNITY): Payer: Self-pay

## 2012-03-04 DIAGNOSIS — N939 Abnormal uterine and vaginal bleeding, unspecified: Secondary | ICD-10-CM

## 2012-03-04 DIAGNOSIS — N898 Other specified noninflammatory disorders of vagina: Secondary | ICD-10-CM | POA: Insufficient documentation

## 2012-03-04 DIAGNOSIS — I10 Essential (primary) hypertension: Secondary | ICD-10-CM | POA: Insufficient documentation

## 2012-03-04 DIAGNOSIS — R1032 Left lower quadrant pain: Secondary | ICD-10-CM | POA: Insufficient documentation

## 2012-03-04 LAB — BASIC METABOLIC PANEL
BUN: 11 mg/dL (ref 6–23)
CO2: 25 mEq/L (ref 19–32)
Calcium: 9.3 mg/dL (ref 8.4–10.5)
Chloride: 102 mEq/L (ref 96–112)
Creatinine, Ser: 0.7 mg/dL (ref 0.50–1.10)

## 2012-03-04 LAB — URINALYSIS, ROUTINE W REFLEX MICROSCOPIC
Glucose, UA: NEGATIVE mg/dL
Protein, ur: 30 mg/dL — AB
Urobilinogen, UA: 1 mg/dL (ref 0.0–1.0)

## 2012-03-04 LAB — CBC
Platelets: 216 10*3/uL (ref 150–400)
RBC: 4.13 MIL/uL (ref 3.87–5.11)
RDW: 13.2 % (ref 11.5–15.5)
WBC: 5.7 10*3/uL (ref 4.0–10.5)

## 2012-03-04 LAB — URINE MICROSCOPIC-ADD ON

## 2012-03-04 LAB — PREGNANCY, URINE: Preg Test, Ur: NEGATIVE

## 2012-03-04 LAB — WET PREP, GENITAL
WBC, Wet Prep HPF POC: NONE SEEN
Yeast Wet Prep HPF POC: NONE SEEN

## 2012-03-04 LAB — POCT PREGNANCY, URINE: Preg Test, Ur: NEGATIVE

## 2012-03-04 MED ORDER — ONDANSETRON HCL 4 MG PO TABS: 4.0000 mg | ORAL_TABLET | Freq: Four times a day (QID) | ORAL | Status: AC

## 2012-03-04 NOTE — ED Notes (Signed)
Quantitative hCG not done---pt negative for pregnancy.

## 2012-03-04 NOTE — ED Provider Notes (Signed)
History     CSN: [id number]  Arrival date & time 03/04/12  1932   First MD Initiated Contact with Patient 03/04/12 2017      Chief Complaint  Patient presents with  . Vaginal Bleeding    (Consider location/radiation/quality/duration/timing/severity/associated sxs/prior treatment) HPI Comments: Pt with heavy vaginal bleeding since Jul 8 - states she is passing clots. Began to have slight crampy abd pain over about the past day, located to LLQ without radiation, no known aggravating/alleviating factors. Had similar sx with her menses last month. Reports nausea without emesis. She took a home pregnancy test which she reports was +.  Patient is a 26 y.o. female presenting with vaginal bleeding. The history is provided by the patient.  Vaginal Bleeding This is a new problem. The current episode started in the past 7 days. The problem occurs constantly. The problem has been unchanged. Associated symptoms include abdominal pain and nausea. Pertinent negatives include no anorexia, change in bowel habit, congestion, coughing, fatigue, fever, urinary symptoms or vomiting. Nothing aggravates the symptoms. She has tried nothing for the symptoms.   Pt's blood type is A+ per review of old labs in chart  Past Medical History  Diagnosis Date  . Hypertension   . Trichomonas     Past Surgical History  Procedure Date  . Cesarean section     Family History  Problem Relation Age of Onset  . Anesthesia problems Neg Hx   . Hypertension Other     History  Substance Use Topics  . Smoking status: Never Smoker   . Smokeless tobacco: Never Used  . Alcohol Use: No    OB History    Grav Para Term Preterm Abortions TAB SAB Ect Mult Living   1 1 1  0 0 0 0 0 0 1      Review of Systems  Constitutional: Positive for appetite change. Negative for fever and fatigue.  HENT: Negative for congestion.   Respiratory: Negative for cough.   Gastrointestinal: Positive for nausea and abdominal pain.  Negative for vomiting, anorexia and change in bowel habit.  Genitourinary: Positive for vaginal bleeding.  All other systems reviewed and are negative.    Allergies  Omeprazole  Home Medications   Current Outpatient Rx  Name Route Sig Dispense Refill  . ASPIRIN-ACETAMINOPHEN-CAFFEINE 250-250-65 MG PO TABS Oral Take 2 tablets by mouth every 8 (eight) hours as needed. For migraines.      BP 132/87  Pulse 81  Temp 99.1 F (37.3 C) (Oral)  Resp 18  SpO2 100%  LMP 01/01/2012  Physical Exam  Nursing note and vitals reviewed. Constitutional: She appears well-developed and well-nourished. No distress.  HENT:  Head: Normocephalic and atraumatic.  Eyes:       Normal appearance  Neck: Normal range of motion.  Cardiovascular: Normal rate, regular rhythm and normal heart sounds.   Pulmonary/Chest: Effort normal and breath sounds normal.  Abdominal: Soft. Bowel sounds are normal.       Mildly tender without guard/rebound to LLQ  Genitourinary: Vagina normal and uterus normal.       NT chaperone present during exam Large amt blood in vaginal vault Os closed, no CMT Mild tenderness to palp to L adnexa on bimanual although no obvious masses  Musculoskeletal: Normal range of motion.  Neurological: She is alert.  Skin: Skin is warm and dry. She is not diaphoretic.  Psychiatric: She has a normal mood and affect.    ED Course  Procedures (including critical care time)  Labs Reviewed  URINALYSIS, ROUTINE W REFLEX MICROSCOPIC - Abnormal; Notable for the following:    Color, Urine RED (*)  BIOCHEMICALS MAY BE AFFECTED BY COLOR   APPearance CLOUDY (*)     Hgb urine dipstick LARGE (*)     Ketones, ur TRACE (*)     Protein, ur 30 (*)     Leukocytes, UA SMALL (*)     All other components within normal limits  WET PREP, GENITAL - Abnormal; Notable for the following:    Clue Cells Wet Prep HPF POC MODERATE (*)     All other components within normal limits  CBC - Abnormal; Notable for  the following:    HCT 35.1 (*)     All other components within normal limits  PREGNANCY, URINE  BASIC METABOLIC PANEL  GC/CHLAMYDIA PROBE AMP, GENITAL  POCT PREGNANCY, URINE  URINE MICROSCOPIC-ADD ON   US Transvaginal Non-ob  03/04/2012  *RADIOLOGY REPORT*  Clinical Data: Left lower quadrant pain, menorrhagia.  Prior c- section.  TRANSABDOMINAL AND TRANSVAGINAL ULTRASOUND OF PELVIS  Technique:  Both transabdominal and transvaginal ultrasound examinations of the pelvis were performed.  Transabdominal technique was performed for global imaging of the pelvis including uterus, ovaries, adnexal regions, and pelvic cul-de-sac.  It was necessary to proceed with endovaginal exam following the transabdominal exam to visualize the endometrium and right ovary.  Comparison:  09/18/2011  Findings: Uterus:  9.2 x 4.9 x 3.6 cm.  Anteverted, retroflexed.  No focal abnormality.  Endometrium: 4 mm.  Trilaminar in appearance without focal abnormality.  Right ovary: 4.9 x 2.3 x 1.6 cm.  Normal.  Left ovary: 3.9 x 2.7 x 1.5 cm.  Normal.  Other Findings:  No free fluid  IMPRESSION: Normal exam.  Original Report Authenticated By: Harrel Lemon, M.D.   US Pelvis Complete  03/04/2012  *RADIOLOGY REPORT*  Clinical Data: Left lower quadrant pain, menorrhagia.  Prior c- section.  TRANSABDOMINAL AND TRANSVAGINAL ULTRASOUND OF PELVIS  Technique:  Both transabdominal and transvaginal ultrasound examinations of the pelvis were performed.  Transabdominal technique was performed for global imaging of the pelvis including uterus, ovaries, adnexal regions, and pelvic cul-de-sac.  It was necessary to proceed with endovaginal exam following the transabdominal exam to visualize the endometrium and right ovary.  Comparison:  09/18/2011  Findings: Uterus:  9.2 x 4.9 x 3.6 cm.  Anteverted, retroflexed.  No focal abnormality.  Endometrium: 4 mm.  Trilaminar in appearance without focal abnormality.  Right ovary: 4.9 x 2.3 x 1.6 cm.  Normal.   Left ovary: 3.9 x 2.7 x 1.5 cm.  Normal.  Other Findings:  No free fluid  IMPRESSION: Normal exam.  Original Report Authenticated By: Harrel Lemon, M.D.     1. Vaginal bleeding       MDM  Pt with heavy menstrual bleeding x 7 days. Similar sx with menses last month. Reports + preg test at home. Urine preg negative here. H/H c/w previous. [place] with no focal abnormalities. Pt instructed to f/u with GYN or at [facility] should her sx persist. Reasons to return to the ED discussed.    Grant Fontana, PA-C 03/05/12 1539

## 2012-03-04 NOTE — ED Notes (Signed)
Pt reports she has had vaginal bleeding in June and July 8th. States she has taken two pregnancy tests each time and it was positive.

## 2012-03-04 NOTE — ED Notes (Signed)
Pt states she is having heavy vaginal bleeding  Pt states she took a pregnancy test that was positive  Pt states she has been bleeding since the 8th  Pt states she had the same thing happen last month with heavy bleeding and passing clots so she decided to come in for an evaluation

## 2012-03-05 LAB — GC/CHLAMYDIA PROBE AMP, GENITAL: GC Probe Amp, Genital: NEGATIVE

## 2012-03-05 NOTE — ED Provider Notes (Signed)
Medical screening examination/treatment/procedure(s) were performed by non-physician practitioner and as supervising physician I was immediately available for consultation/collaboration.   Loren Racer, MD 03/05/12 [phone]

## 2012-11-06 ENCOUNTER — Ambulatory Visit (INDEPENDENT_AMBULATORY_CARE_PROVIDER_SITE_OTHER): Payer: BC Managed Care – PPO | Admitting: Family Medicine

## 2012-11-06 ENCOUNTER — Encounter: Payer: Self-pay | Admitting: Family Medicine

## 2012-11-06 VITALS — BP 141/96 | HR 80 | Temp 98.1°F | Ht 63.0 in | Wt 136.0 lb

## 2012-11-06 DIAGNOSIS — R030 Elevated blood-pressure reading, without diagnosis of hypertension: Secondary | ICD-10-CM

## 2012-11-06 DIAGNOSIS — A6000 Herpesviral infection of urogenital system, unspecified: Secondary | ICD-10-CM

## 2012-11-06 DIAGNOSIS — R300 Dysuria: Secondary | ICD-10-CM | POA: Insufficient documentation

## 2012-11-06 DIAGNOSIS — N898 Other specified noninflammatory disorders of vagina: Secondary | ICD-10-CM

## 2012-11-06 HISTORY — DX: Herpesviral infection of urogenital system, unspecified: A60.00

## 2012-11-06 LAB — POCT URINALYSIS DIPSTICK
Glucose, UA: NEGATIVE
pH, UA: 7

## 2012-11-06 LAB — POCT UA - MICROSCOPIC ONLY

## 2012-11-06 MED ORDER — LIDOCAINE 5 % EX OINT: TOPICAL_OINTMENT | CUTANEOUS | Status: DC | PRN

## 2012-11-06 MED ORDER — VALACYCLOVIR HCL 1 G PO TABS: 1000.0000 mg | ORAL_TABLET | Freq: Two times a day (BID) | ORAL | Status: DC

## 2012-11-06 MED ORDER — PHENAZOPYRIDINE HCL 100 MG PO TABS: 100.0000 mg | ORAL_TABLET | Freq: Three times a day (TID) | ORAL | Status: DC | PRN

## 2012-11-06 NOTE — Progress Notes (Signed)
Subjective:     Patient ID: Catherine Martin, female   DOB: November 23, 1985, 27 y.o.   MRN: [id number]  HPI 27 yo F presents for same day visit with complaint of dysuria x 3 days. Pain is associated with urgency. Pain is also associated with burning sensation in the perineum. She denies frequency, fever, nausea and vomiting.  Patient is sexually active with one partner and uses condoms. Has history of STIs in the the past.   Elevated BP: smoking one cigarette per day. Walks often. Denies HA, CP, SOB, LE edema.   Review of Systems As per HPI     Objective:   Physical Exam BP 141/96  Pulse 80  Temp(Src) 98.1 F (36.7 C) (Oral)  Ht 5\' 3"  (1.6 m)  Wt 136 lb (61.689 kg)  BMI 24.1 kg/m2  LMP 10/21/2012 General appearance: alert, cooperative and no distress Pelvic: macerated perineum with scattered vesicle and honey colored crust. white vaginal discharge noted. speculum deferred due to external lesions and tenderness.  Area swabbed for HSV.   UA: not a clean catch. BV noted.      Assessment and Plan:

## 2012-11-06 NOTE — Patient Instructions (Addendum)
Catherine Martin,  Thank you for coming in today.   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  I have swabbed the area and will have results in a few days. In the meantime please take valtrex and use lidocaine cream for pain.   I have sent urine for culture. Drink plenty of water. Take pyridium for pain.   For HTN: Continue to exercise regularly Avoid extra salt on food (shaking salt, salty snacks (chips, crackers), canned foods, fast food).  Excellent job with cutting down smoking. I believe you can get rid of that one cigarette per day. I recommend that you take a walk when you feel stressed.  Keep a log of your BP 3-4 times daily and come back in 2-4 weeks to see Dr. McDiarmid for BP f/u.   Genital Herpes Genital herpes is a sexually transmitted disease. This means that it is a disease passed by having sex with an infected person. There is no cure for genital herpes. The time between attacks can be months to years. The virus may live in a person but produce no problems (symptoms). This infection can be passed to a baby as it travels down the birth canal (vagina). In a newborn, this can cause central nervous system damage, eye damage, or even death. The virus that causes genital herpes is usually HSV-2 virus. The virus that causes oral herpes is usually HSV-1. The diagnosis (learning what is wrong) is made through culture results. SYMPTOMS  Usually symptoms of pain and itching begin a few days to a week after contact. It first appears as small blisters that progress to small painful ulcers which then scab over and heal after several days. It affects the outer genitalia, birth canal, cervix, penis, anal area, buttocks, and thighs. HOME CARE INSTRUCTIONS   Keep ulcerated areas dry and clean.  Take medications as directed. Antiviral medications can speed up healing. They will not prevent recurrences or cure this infection. These medications can  also be taken for suppression if there are frequent recurrences.  While the infection is active, it is contagious. Avoid all sexual contact during active infections.  Condoms may help prevent spread of the herpes virus.  Practice safe sex.  Wash your hands thoroughly after touching the genital area.  Avoid touching your eyes after touching your genital area.  Inform your caregiver if you have had genital herpes and become pregnant. It is your responsibility to insure a safe outcome for your baby in this pregnancy.  Only take over-the-counter or prescription medicines for pain, discomfort, or fever as directed by your caregiver. SEEK MEDICAL CARE IF:   You have a recurrence of this infection.  You do not respond to medications and are not improving.  You have new sources of pain or discharge which have changed from the original infection.  You have an oral temperature above 102 F (38.9 C).  You develop abdominal pain.  You develop eye pain or signs of eye infection. Document Released: 08/04/2000 Document Revised: 10/30/2011 Document Reviewed: 08/25/2009 [facility] Patient Information 2013 [place], [place].

## 2012-11-06 NOTE — Assessment & Plan Note (Signed)
A: elevated BP P: Continue to exercise regularly Avoid extra salt on food (shaking salt, salty snacks (chips, crackers), canned foods, fast food).  Excellent job with cutting down smoking. I believe you can get rid of that one cigarette per day. I recommend that you take a walk when you feel stressed.  Keep a log of your BP 3-4 times daily and come back in 2-4 weeks to see Dr. McDiarmid for BP f/u.

## 2012-11-06 NOTE — Assessment & Plan Note (Signed)
A: exam consistent with genital herpes. Dysuria likely 2/2 to urethral irritation.  P: Sent urine for culture, however specimen is definitely not a clean catch due to pain with wiping. Treat with valtrex 1000 mg BID x 10 days Topical lidocaine for pain Pyridium for dsyuria and lots of water for dysuria.  Plan to have patient return if dysuria persist despite treatment with valtrex for a repeat UA.

## 2012-11-07 ENCOUNTER — Encounter: Payer: Self-pay | Admitting: Family Medicine

## 2012-11-08 ENCOUNTER — Telehealth: Payer: Self-pay | Admitting: Family Medicine

## 2012-11-08 LAB — URINE CULTURE: Colony Count: >=100000

## 2012-11-08 NOTE — Telephone Encounter (Signed)
Called patient. 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 She is taking valtrex. Lidocaine ointment is not helping much.  Plan: advised use of tylenol or motrin.  Patient may apply A&D ointment as barrier. To call on Monday if pain still significant and will be happy to order stronger medication.

## 2012-11-13 ENCOUNTER — Encounter: Payer: Self-pay | Admitting: Family Medicine

## 2013-09-16 ENCOUNTER — Encounter: Payer: Self-pay | Admitting: *Deleted

## 2014-06-09 ENCOUNTER — Emergency Department (HOSPITAL_COMMUNITY): Payer: BC Managed Care – PPO

## 2014-06-09 ENCOUNTER — Emergency Department (HOSPITAL_COMMUNITY)
Admission: EM | Admit: 2014-06-09 | Discharge: 2014-06-09 | Disposition: A | Discharge status: Home / Self Care | Payer: Self-pay | Attending: Emergency Medicine | Admitting: Emergency Medicine

## 2014-06-09 ENCOUNTER — Encounter (HOSPITAL_COMMUNITY): Payer: Self-pay | Admitting: Emergency Medicine

## 2014-06-09 DIAGNOSIS — I10 Essential (primary) hypertension: Secondary | ICD-10-CM | POA: Insufficient documentation

## 2014-06-09 DIAGNOSIS — M549 Dorsalgia, unspecified: Secondary | ICD-10-CM | POA: Insufficient documentation

## 2014-06-09 DIAGNOSIS — Z87448 Personal history of other diseases of urinary system: Secondary | ICD-10-CM | POA: Insufficient documentation

## 2014-06-09 DIAGNOSIS — M7541 Impingement syndrome of right shoulder: Secondary | ICD-10-CM

## 2014-06-09 DIAGNOSIS — Z8619 Personal history of other infectious and parasitic diseases: Secondary | ICD-10-CM | POA: Insufficient documentation

## 2014-06-09 DIAGNOSIS — Z720 Tobacco use: Secondary | ICD-10-CM | POA: Insufficient documentation

## 2014-06-09 MED ORDER — HYDROCODONE-ACETAMINOPHEN 5-325 MG PO TABS: 2.0000 | ORAL_TABLET | ORAL | Status: DC | PRN

## 2014-06-09 MED ORDER — HYDROCODONE-ACETAMINOPHEN 5-325 MG PO TABS
2.0000 | ORAL_TABLET | Freq: Once | ORAL | Status: AC
  Administered 2014-06-09: 2 via ORAL
  Filled 2014-06-09: qty 2

## 2014-06-09 MED ORDER — NAPROXEN 500 MG PO TABS: 500.0000 mg | ORAL_TABLET | Freq: Two times a day (BID) | ORAL | Status: DC

## 2014-06-09 NOTE — ED Provider Notes (Signed)
CSN: [id number]     Arrival date & time 06/09/14  1051 History  This chart was scribed for non-physician practitioner, Emilia BeckKaitlyn Brenn Deziel, PA-C, working with Vanetta MuldersScott Zackowski, MD, by Bronson CurbJacqueline Melvin, ED Scribe. This patient was seen in room TR09C/TR09C and the patient's care was started at 11:29 AM.    Chief Complaint  Patient presents with  . Arm Pain     Patient is a 28 y.o. female presenting with arm pain. The history is provided by the patient. No language interpreter was used.  Arm Pain This is a new problem. The current episode started more than 2 days ago. The problem has been gradually worsening. Pertinent negatives include no headaches and no shortness of breath. The symptoms are aggravated by bending. Nothing relieves the symptoms.    HPI Comments: Catherine Martin is a 28 y.o. female who presents to the Emergency Department complaining of gradually worsening, constant right arm pain over the last few days. Patient states she was supposed get a flu shot, but was unable to receive it due to pain. She reports when she woke up yesterday, she could not "feel her fingers". There is associated paresthesia and back pain. She reports the pain is worse with movement of her arm. Patient denies any injury.   Past Medical History  Diagnosis Date  . Hypertension   . Trichomonas   . GONORRHEA 01/24/2007    Qualifier: History of  By: McDiarmid MD, Tawanna Coolerodd    . CERVICITIS, CHLAMYDIAL 11/30/2008    Qualifier: Diagnosis of  By: McDiarmid MD, Tawanna Coolerodd    . PREGNANCY-INDUCED HYPERTENSION 09/06/2007    Qualifier: History of  By: McDiarmid MD, Tawanna Coolerodd     Past Surgical History  Procedure Laterality Date  . Cesarean section     Family History  Problem Relation Age of Onset  . Anesthesia problems Neg Hx   . Hypertension Other   . Hypertension Mother    History  Substance Use Topics  . Smoking status: Current Every Day Smoker -- 0.05 packs/day    Types: Cigarettes  . Smokeless tobacco: Never Used  .  Alcohol Use: No   OB History   Grav Para Term Preterm Abortions TAB SAB Ect Mult Living   1 1 1  0 0 0 0 0 0 1     Review of Systems  Respiratory: Negative for shortness of breath.   Musculoskeletal: Positive for back pain and myalgias (right arm). Negative for neck pain.  Neurological: Positive for numbness. Negative for headaches.      Allergies  Omeprazole  Home Medications   Prior to Admission medications   Not on File   Triage Vitals: BP 154/75  Pulse 80  Temp(Src) 98.5 F (36.9 C) (Oral)  Resp 20  Ht 5\' 3"  (1.6 m)  Wt 135 lb (61.236 kg)  BMI 23.92 kg/m2  SpO2 100%  Physical Exam  Nursing note and vitals reviewed. Constitutional: She is oriented to person, place, and time. She appears well-developed and well-nourished. No distress.  HENT:  Head: Normocephalic and atraumatic.  Eyes: Conjunctivae and EOM are normal.  Neck: Neck supple. No tracheal deviation present.  Cardiovascular: Normal rate.   Pulmonary/Chest: Effort normal. No respiratory distress.  Musculoskeletal: Normal range of motion.  C5-C7 tenderness to palpation. Limited ROM of right shoulder, elbow and hand due to pain. No obvious deformity.   Neurological: She is alert and oriented to person, place, and time.  Skin: Skin is warm and dry.  Psychiatric: She has a normal mood  and affect. Her behavior is normal.    ED Course  Procedures (including critical care time)  DIAGNOSTIC STUDIES: Oxygen Saturation is 100% on room air, normal by my interpretation.    COORDINATION OF CARE: At 1133 Discussed treatment plan with patient which includes imaging. Patient agrees.   Labs Review Labs Reviewed - No data to display  Imaging Review Dg Cervical Spine Complete  06/09/2014   CLINICAL DATA:  Right arm pain for 1 week, no injury  EXAM: CERVICAL SPINE  4+ VIEWS  COMPARISON:  None.  FINDINGS: There is no evidence of cervical spine fracture or prevertebral soft tissue swelling. Alignment is normal. No  other significant bone abnormalities are identified.  IMPRESSION: No acute abnormality noted.   Electronically Signed   By: Alcide CleverMark  Lukens M.D.   On: 06/09/2014 12:19     EKG Interpretation None      MDM   Final diagnoses:  Impingement syndrome of right shoulder   Patient's xray unremarkable for acute changes. Patient will have vicodin and naprosyn for symptoms. Patient instructed to apply ice.    I personally performed the services described in this documentation, which was scribed in my presence. The recorded information has been reviewed and is accurate.    Emilia BeckKaitlyn Quintarius Ferns, [place] JerseyPA-C 06/09/14 [phone]

## 2014-06-09 NOTE — ED Notes (Signed)
Pt in c/o right arm pain, states it started in her shoulder and was worse with movement, reports numbness and tingling to arm and hand, over the last few days patient has noted severe pain when moving her hand, and that movement makes her whole arm hurt, also reports lower back pain, denies neck pain, denies injury

## 2014-06-09 NOTE — ED Notes (Signed)
Per patient request, called out to nurse 1st to have them give her grandmother a message to pick up her kids.

## 2014-06-09 NOTE — Discharge Instructions (Signed)
Take Vicodin as needed for pain. Take Naprosyn as directed for inflammation. Refer to attached documents for more information.

## 2014-06-10 NOTE — ED Provider Notes (Signed)
Medical screening examination/treatment/procedure(s) were performed by non-physician practitioner and as supervising physician I was immediately available for consultation/collaboration.   EKG Interpretation None        Bless Belshe, MD 06/10/14 2321 

## 2014-06-22 ENCOUNTER — Encounter (HOSPITAL_COMMUNITY): Payer: Self-pay | Admitting: Emergency Medicine

## 2017-01-10 LAB — GLUCOSE, POCT (MANUAL RESULT ENTRY): POC GLUCOSE: 88 mg/dL (ref 70–99)

## 2019-11-10 ENCOUNTER — Other Ambulatory Visit: Payer: Self-pay

## 2019-11-10 ENCOUNTER — Ambulatory Visit (HOSPITAL_COMMUNITY)
Admission: EM | Admit: 2019-11-10 | Discharge: 2019-11-10 | Disposition: A | Discharge status: Home / Self Care | Payer: No Typology Code available for payment source | Attending: Family Medicine | Admitting: Family Medicine

## 2019-11-10 ENCOUNTER — Encounter (HOSPITAL_COMMUNITY): Payer: Self-pay | Admitting: Emergency Medicine

## 2019-11-10 DIAGNOSIS — T148XXA Other injury of unspecified body region, initial encounter: Secondary | ICD-10-CM

## 2019-11-10 MED ORDER — TIZANIDINE HCL 4 MG PO TABS: 4.0000 mg | ORAL_TABLET | Freq: Four times a day (QID) | ORAL | 0 refills | Status: DC | PRN

## 2019-11-10 MED ORDER — NAPROXEN 500 MG PO TABS: 500.0000 mg | ORAL_TABLET | Freq: Two times a day (BID) | ORAL | 0 refills | Status: DC

## 2019-11-10 NOTE — ED Provider Notes (Signed)
[facility]    CSN: [id number] Arrival date & time: 11/10/19  1705      History   Chief Complaint Chief Complaint  Patient presents with  . Back Pain    HPI Catherine Martin is a 34 y.o. female.   HPI Patient states she woke up with pain in her upper back about 6 days ago.  Over the weekend it got worse.  She states that today when she went to work she was sent home because she was in a lot of discomfort.  Its in her neck and shoulder area down to about the bra strap.  Pain with movement of the neck.  Pain with shrugging of the shoulders.  No numbness or weakness into the arms.  No headache.  No trauma no injury.  She does work at a [employer].  She is uncertain whether her computer is ergonomic.  She does state she has had some stress.  No known cervical disc disease or orthopedic conditions. She has not tried any medication at home. Past Medical History:  Diagnosis Date  . CERVICITIS, CHLAMYDIAL 11/30/2008   Qualifier: Diagnosis of  By: McDiarmid MD, Tawanna Cooler    . GONORRHEA 01/24/2007   Qualifier: History of  By: McDiarmid MD, Tawanna Cooler    . Hypertension   . PREGNANCY-INDUCED HYPERTENSION 09/06/2007   Qualifier: History of  By: McDiarmid MD, Tawanna Cooler    . Trichomonas     Patient Active Problem List   Diagnosis Date Noted  . Genital herpes 11/06/2012  . Dysuria 11/06/2012  . ELEVATED BP READING WITHOUT DX HYPERTENSION 02/02/2010  . TOBACCO USER 08/23/2009  . MOOD DISORDER 02/01/2009  . ASCUS PAP 01/24/2007  . TENSION HEADACHE 10/18/2006  . MIGRAINE, UNSPEC., W/O INTRACTABLE MIGRAINE 10/18/2006  . DYSPLASIA, CERVIX, MILD 09/05/2005    Past Surgical History:  Procedure Laterality Date  . CESAREAN SECTION      OB History    Gravida  1   Para  1   Term  1   Preterm  0   AB  0   Living  1     SAB  0   TAB  0   Ectopic  0   Multiple  0   Live Births               Home Medications    Prior to Admission medications   Medication Sig Start Date End  Date Taking? Authorizing Provider  naproxen (NAPROSYN) 500 MG tablet Take 1 tablet (500 mg total) by mouth 2 (two) times daily. 11/10/19   Eustace Moore, MD  tiZANidine (ZANAFLEX) 4 MG tablet Take 1-2 tablets (4-8 mg total) by mouth every 6 (six) hours as needed for muscle spasms. 11/10/19   Eustace Moore, MD    Family History Family History  Problem Relation Age of Onset  . Anesthesia problems Neg Hx   . Hypertension Other   . Hypertension Mother     Social History Social History   Tobacco Use  . Smoking status: Current Every Day Smoker    Packs/day: 0.05    Types: Cigarettes  . Smokeless tobacco: Never Used  Substance Use Topics  . Alcohol use: No  . Drug use: No     Allergies   Omeprazole   Review of Systems Review of Systems  Musculoskeletal: Positive for neck pain and neck stiffness.  Neurological: Negative for weakness and numbness.     Physical Exam Triage Vital Signs ED Triage Vitals  Enc Vitals Group     BP 11/10/19 1803 (!) 150/98     Pulse Rate 11/10/19 1803 91     Resp 11/10/19 1803 16     Temp 11/10/19 1803 98.5 F (36.9 C)     Temp Source 11/10/19 1803 Oral     SpO2 11/10/19 1803 99 %     Weight --      Height --      Head Circumference --      Peak Flow --      Pain Score 11/10/19 1824 8     Pain Loc --      Pain Edu? --      Excl. in [place]? --    No data found.  Updated Vital Signs BP (!) 150/98 (BP Location: Left Arm)   Pulse 91   Temp 98.5 F (36.9 C) (Oral)   Resp 16   SpO2 99%     Physical Exam Constitutional:      General: She is not in acute distress.    Appearance: Normal appearance. She is well-developed and normal weight.     Comments: Slight stiff posture.  Pleasant and congenial  HENT:     Head: Normocephalic and atraumatic.     Mouth/Throat:     Comments: Mask in place Eyes:     Conjunctiva/sclera: Conjunctivae normal.     Pupils: Pupils are equal, round, and reactive to light.  Cardiovascular:     Rate  and Rhythm: Normal rate.  Pulmonary:     Effort: Pulmonary effort is normal. No respiratory distress.  Musculoskeletal:        General: Normal range of motion.     Cervical back: Normal range of motion.     Comments: Tenderness in the neck muscles and upper body trapezius.  Mild increased muscle tone.  Limited range of motion of neck.  Strength sensation range of motion and reflexes are symmetric in both upper extremities  Skin:    General: Skin is warm and dry.  Neurological:     Mental Status: She is alert.  Psychiatric:        Mood and Affect: Mood normal.        Behavior: Behavior normal.      UC Treatments / Results  Labs (all labs ordered are listed, but only abnormal results are displayed) Labs Reviewed - No data to display  EKG   Radiology No results found.  Procedures Procedures (including critical care time)  Medications Ordered in UC Medications - No data to display  Initial Impression / Assessment and Plan / UC Course  I have reviewed the triage vital signs and the nursing notes.  Pertinent labs & imaging results that were available during my care of the patient were reviewed by me and considered in my medical decision making (see chart for details).     We reviewed that she has muscular neck and upper back pain.  This should resolve with conservative management.  X-rays not indicated. Final Clinical Impressions(s) / UC Diagnoses   Final diagnoses:  Muscle strain     Discharge Instructions     Take Naprosyn 3 times a day with food Take tizanidine as needed as muscle relaxer.  This is especially helpful at bedtime. Use ice or heat to painful muscles. Activity as tolerated, avoid activities that increase your pain Expect improvement over the next few days Return as needed    ED Prescriptions    Medication Sig Dispense Auth. Provider  naproxen (NAPROSYN) 500 MG tablet Take 1 tablet (500 mg total) by mouth 2 (two) times daily. 30 tablet Eustace Moore, MD   tiZANidine (ZANAFLEX) 4 MG tablet Take 1-2 tablets (4-8 mg total) by mouth every 6 (six) hours as needed for muscle spasms. 21 tablet Eustace Moore, MD     PDMP not reviewed this encounter.   Eustace Moore, MD 11/10/19 [phone]

## 2019-11-10 NOTE — ED Triage Notes (Signed)
Pt here for upper to mid back pain that pt sts feels like muscle spasms x 6 days

## 2019-11-10 NOTE — Discharge Instructions (Signed)
Take Naprosyn 3 times a day with food Take tizanidine as needed as muscle relaxer.  This is especially helpful at bedtime. Use ice or heat to painful muscles. Activity as tolerated, avoid activities that increase your pain Expect improvement over the next few days Return as needed

## 2020-07-08 ENCOUNTER — Other Ambulatory Visit: Payer: Self-pay

## 2020-07-08 ENCOUNTER — Encounter: Payer: Self-pay | Admitting: Family Medicine

## 2020-07-08 ENCOUNTER — Ambulatory Visit (INDEPENDENT_AMBULATORY_CARE_PROVIDER_SITE_OTHER): Payer: No Typology Code available for payment source | Admitting: Family Medicine

## 2020-07-08 VITALS — BP 146/90 | HR 91 | Ht 63.0 in | Wt 165.0 lb

## 2020-07-08 DIAGNOSIS — Z1159 Encounter for screening for other viral diseases: Secondary | ICD-10-CM

## 2020-07-08 DIAGNOSIS — Z9189 Other specified personal risk factors, not elsewhere classified: Secondary | ICD-10-CM

## 2020-07-08 DIAGNOSIS — E663 Overweight: Secondary | ICD-10-CM | POA: Diagnosis not present

## 2020-07-08 DIAGNOSIS — T148XXA Other injury of unspecified body region, initial encounter: Secondary | ICD-10-CM

## 2020-07-08 DIAGNOSIS — Z114 Encounter for screening for human immunodeficiency virus [HIV]: Secondary | ICD-10-CM | POA: Diagnosis not present

## 2020-07-08 DIAGNOSIS — G43109 Migraine with aura, not intractable, without status migrainosus: Secondary | ICD-10-CM

## 2020-07-08 DIAGNOSIS — I10 Essential (primary) hypertension: Secondary | ICD-10-CM

## 2020-07-08 LAB — POCT GLYCOSYLATED HEMOGLOBIN (HGB A1C): Hemoglobin A1C: 5.4 % (ref 4.0–5.6)

## 2020-07-08 MED ORDER — METOPROLOL SUCCINATE ER 50 MG PO TB24: 50.0000 mg | ORAL_TABLET | Freq: Every day | ORAL | 2 refills | Status: DC

## 2020-07-08 NOTE — Patient Instructions (Signed)
I believe you do have hypertension.  I also believe you have migraine with aura (change in your vision before the headache)  I would like you to try metoprolol succinate 50 mg daily in the morning with food.  We are trying to treat both you high blood pressure and your migraines with one medication.    Our goal is to get you taking Excedrin to less than 10 times a month.  This may take a few months to find the right dose and medication to prevent your migraines.  There are may medical options to prevent migraines, so if this first does not work, don't give up. Come back.   Lets plan for a recheck of headaches and blood pressure when you come back for your Pap smear.

## 2020-07-08 NOTE — Progress Notes (Addendum)
Catherine Martin is alone Sources of clinical information for visit is/are patient and past medical records. Nursing assessment for this office visit was reviewed with the patient for accuracy and revision.     Previous Report(s) Reviewed: historical medical records, lab reports and office notes  Depression screen [facility] 2/9 07/08/2020  Decreased Interest 1  Down, Depressed, Hopeless 0  PHQ - 2 Score 1  Altered sleeping 0  Tired, decreased energy 1  Change in appetite 2  Feeling bad or failure about yourself  0  Trouble concentrating 0  Moving slowly or fidgety/restless 0  Suicidal thoughts 0  PHQ-9 Score 4    No flowsheet data found.  PHQ9 SCORE ONLY 07/08/2020  PHQ-9 Total Score 4    Adult vaccines due  Topic Date Due  . TETANUS/TDAP  Never done    Health Maintenance Due  Topic Date Due  . Hepatitis C Screening  Never done  . TETANUS/TDAP  Never done  . PAP SMEAR-Modifier  12/03/2011      History/P.E. limitations: none  Adult vaccines due  Topic Date Due  . TETANUS/TDAP  Never done   There are no preventive care reminders to display for this patient.  Health Maintenance Due  Topic Date Due  . Hepatitis C Screening  Never done  . TETANUS/TDAP  Never done  . PAP SMEAR-Modifier  12/03/2011     Chief Complaint  Patient presents with  . Hypertension  . Headache  Catherine Martin is known to me as I was her PCP several years ago.  I also care for her mother and sister.  Elevated BP  Disease Monitoring  Checking Blood pressure at home: yes, 140-160s SBP; 80-90 DBP   Chest pain: infrequent, brief (sec) pains   Dyspnea: no   Claudication: no   Medication compliance: no meds currently    PMH: office BPs in 140-160 range SBP and 80-90 range DBP.  No history of diagnosed htn.  No hx CKD.  No hx of ASCVD related events. Weight up 30 lbs since 2014   HEADACHE  History of migraines - current headaches similar to usual migraine headaches, except now more  frequent Onset: many years ago (atleast 2008), but has become more frequent over 2021.   Location: Always located around her right eye   Severity: moderate-to-severe Duration: duration variable, less than an hour if takes  Excedrin Migraine Interference with usual activities: Has to go into her office and turn off lights Frequency: Currently, about every other day Worsening: migraines were worse with OCP contraception Similarity to prior headaches: yes  Frequency & Type of analgesic use & duration: Ecedrin Migraine every other day for last year. No GI upset.    Prior treatments: no prescription treatments.  None Prior Diagnostic work-up: diagnosed of migraine without aura before 2008 [facility] initial ov.   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

## 2020-07-09 ENCOUNTER — Encounter: Payer: Self-pay | Admitting: Family Medicine

## 2020-07-09 LAB — CMP14+EGFR
ALT: 8 IU/L (ref 0–32)
AST: 14 IU/L (ref 0–40)
Albumin/Globulin Ratio: 1.7 (ref 1.2–2.2)
Albumin: 4.6 g/dL (ref 3.8–4.8)
Alkaline Phosphatase: 74 IU/L (ref 44–121)
BUN/Creatinine Ratio: 9 (ref 9–23)
BUN: 7 mg/dL (ref 6–20)
Bilirubin Total: 0.4 mg/dL (ref 0.0–1.2)
CO2: 23 mmol/L (ref 20–29)
Calcium: 9.4 mg/dL (ref 8.7–10.2)
Chloride: 99 mmol/L (ref 96–106)
Creatinine, Ser: 0.78 mg/dL (ref 0.57–1.00)
GFR calc Af Amer: 113 mL/min/{1.73_m2} (ref >59)
GFR calc non Af Amer: 98 mL/min/{1.73_m2} (ref >59)
Globulin, Total: 2.7 g/dL (ref 1.5–4.5)
Glucose: 98 mg/dL (ref 65–99)
Potassium: 4 mmol/L (ref 3.5–5.2)
Sodium: 136 mmol/L (ref 134–144)
Total Protein: 7.3 g/dL (ref 6.0–8.5)

## 2020-07-09 LAB — CBC WITH DIFFERENTIAL/PLATELET
Basophils Absolute: 0 10*3/uL (ref 0.0–0.2)
Basos: 1 %
EOS (ABSOLUTE): 0 10*3/uL (ref 0.0–0.4)
Eos: 0 %
Hematocrit: 40.3 % (ref 34.0–46.6)
Hemoglobin: 14 g/dL (ref 11.1–15.9)
Immature Grans (Abs): 0 10*3/uL (ref 0.0–0.1)
Immature Granulocytes: 0 %
Lymphocytes Absolute: 2.1 10*3/uL (ref 0.7–3.1)
Lymphs: 46 %
MCH: 29.2 pg (ref 26.6–33.0)
MCHC: 34.7 g/dL (ref 31.5–35.7)
MCV: 84 fL (ref 79–97)
Monocytes Absolute: 0.5 10*3/uL (ref 0.1–0.9)
Monocytes: 12 %
Neutrophils Absolute: 1.8 10*3/uL (ref 1.4–7.0)
Neutrophils: 41 %
Platelets: 314 10*3/uL (ref 150–450)
RBC: 4.79 x10E6/uL (ref 3.77–5.28)
RDW: 12.6 % (ref 11.7–15.4)
WBC: 4.4 10*3/uL (ref 3.4–10.8)

## 2020-07-09 LAB — HEPATITIS C ANTIBODY: Hep C Virus Ab: <0.1 s/co ratio (ref 0.0–0.9)

## 2020-07-09 LAB — HIV ANTIBODY (ROUTINE TESTING W REFLEX): HIV Screen 4th Generation wRfx: NONREACTIVE

## 2020-07-09 NOTE — Addendum Note (Signed)
Addended byPerley Jain, Omkar Stratmann D on: 07/09/2020 11:00 AM   Modules accepted: Level of Service

## 2020-07-09 NOTE — Assessment & Plan Note (Signed)
Established problem Uncontrolled  > 15 attacks per month treated with Excedrin Migraine for at least a year At risk of Medication Overuse Headache in addition to her underlying migraine. Also may have component of Menstrual related Migraine Plan Start: Metoprolol succinate 50 mg daily for prophylaxis (and treatment of hypertension) Start decreasing use of Excedrin Migraine as abortive, if possible.

## 2020-07-09 NOTE — Assessment & Plan Note (Signed)
Quit tobacco several years ago.

## 2020-07-09 NOTE — Assessment & Plan Note (Signed)
New diagnosis of chronic condition No evidence of end-organ damage. Weight loss recommended - Catherine Martin has a good idea that wieght gain is from snacking.  Start Metoprolol succinate 50 mg daily for both treatment of hypertension and migraine prophylaxis.  RTC for Pap smear will recheck BP/HR/tolerance.

## 2020-08-05 ENCOUNTER — Other Ambulatory Visit: Payer: Self-pay

## 2020-08-05 ENCOUNTER — Encounter: Payer: Self-pay | Admitting: Family Medicine

## 2020-08-05 ENCOUNTER — Other Ambulatory Visit (HOSPITAL_COMMUNITY)
Admission: RE | Admit: 2020-08-05 | Discharge: 2020-08-05 | Disposition: A | Discharge status: Home / Self Care | Payer: No Typology Code available for payment source | Source: Ambulatory Visit | Attending: Family Medicine | Admitting: Family Medicine

## 2020-08-05 ENCOUNTER — Ambulatory Visit (INDEPENDENT_AMBULATORY_CARE_PROVIDER_SITE_OTHER): Payer: No Typology Code available for payment source | Admitting: Family Medicine

## 2020-08-05 VITALS — BP 110/70 | HR 78 | Ht 63.0 in | Wt 165.4 lb

## 2020-08-05 DIAGNOSIS — I10 Essential (primary) hypertension: Secondary | ICD-10-CM | POA: Diagnosis not present

## 2020-08-05 DIAGNOSIS — Z01419 Encounter for gynecological examination (general) (routine) without abnormal findings: Secondary | ICD-10-CM | POA: Insufficient documentation

## 2020-08-05 DIAGNOSIS — G43109 Migraine with aura, not intractable, without status migrainosus: Secondary | ICD-10-CM | POA: Diagnosis not present

## 2020-08-05 MED ORDER — METOPROLOL SUCCINATE ER 50 MG PO TB24: 50.0000 mg | ORAL_TABLET | Freq: Every day | ORAL | 3 refills | Status: DC

## 2020-08-05 NOTE — Patient Instructions (Addendum)
Your blood pressure is much better and it sounds like you are tolerating the metoprolol.  Please continue the metoprolol for hypertension.  90 day refill with a one year refill sent to your pharmacy.  It sounds like your headaches are less frequent with the metoprolol medication.  If you need to take the Excedrin Migraine more than 10 times a month, please let Dr Elver Stadler know as your migraines may not be under good control.   Merry Christmas and  Happy New Years!

## 2020-08-05 NOTE — Progress Notes (Addendum)
Catherine Martin is alone Sources of clinical information for visit is/are patient. Nursing assessment for this office visit was reviewed with the patient for accuracy and revision.     Previous Report(s) Reviewed: lab reports and office notes  Depression screen [facility] 2/9 08/05/2020  Decreased Interest 1  Down, Depressed, Hopeless 0  PHQ - 2 Score 1  Altered sleeping 0  Tired, decreased energy 2  Change in appetite 1  Feeling bad or failure about yourself  0  Trouble concentrating 1  Moving slowly or fidgety/restless 0  Suicidal thoughts 0  PHQ-9 Score 5    No flowsheet data found.  PHQ9 SCORE ONLY 08/05/2020 07/08/2020  PHQ-9 Total Score 5 4    Adult vaccines due  Topic Date Due  . TETANUS/TDAP  Never done    Health Maintenance Due  Topic Date Due  . TETANUS/TDAP  Never done  . PAP SMEAR-Modifier  12/03/2011      History/P.E. limitations: none  Adult vaccines due  Topic Date Due  . TETANUS/TDAP  Never done   There are no preventive care reminders to display for this patient.  Health Maintenance Due  Topic Date Due  . TETANUS/TDAP  Never done  . PAP SMEAR-Modifier  12/03/2011     Chief Complaint  Patient presents with  . Gynecologic Exam  . Headache    Follow up    Genitalia:  Normal introitus for age, no external lesions, no vaginal discharge, mucosa pink and moist, white, thin discharge in vaginal canal and posterior formix,  no vaginal or cervical lesions, no vaginal atrophy, no friaility or hemorrhage, normal uterus palpable size and contour and normal position midline , no adnexal masses or tenderness

## 2020-08-06 ENCOUNTER — Encounter: Payer: Self-pay | Admitting: Family Medicine

## 2020-08-06 NOTE — Assessment & Plan Note (Signed)
Established problem that has improved.  Catherine Martin is tolerating metoprolol, no dizziness or orthostatic symptoms.  Continue current medication regiment. Rx Metoprolol succinate 50 mg daily, #90 with RF x 3.

## 2020-08-06 NOTE — Assessment & Plan Note (Signed)
Established problem that has improved.  Improvement from every other day of headache to once or twice a week.  Continue metoprolol medication regiment.

## 2020-08-10 ENCOUNTER — Telehealth: Payer: Self-pay | Admitting: Family Medicine

## 2020-08-10 DIAGNOSIS — A599 Trichomoniasis, unspecified: Secondary | ICD-10-CM

## 2020-08-10 DIAGNOSIS — A749 Chlamydial infection, unspecified: Secondary | ICD-10-CM

## 2020-08-10 LAB — CYTOLOGY - PAP
Chlamydia: POSITIVE — AB
Comment: NEGATIVE
Comment: NEGATIVE
Comment: NEGATIVE
Comment: NORMAL
Diagnosis: NEGATIVE
High risk HPV: NEGATIVE
Neisseria Gonorrhea: NEGATIVE
Trichomonas: POSITIVE — AB

## 2020-08-10 MED ORDER — METRONIDAZOLE 500 MG PO TABS: 500.0000 mg | ORAL_TABLET | Freq: Two times a day (BID) | ORAL | 0 refills | Status: AC

## 2020-08-10 MED ORDER — AZITHROMYCIN 250 MG PO TABS: 1000.0000 mg | ORAL_TABLET | Freq: Once | ORAL | 0 refills | Status: AC

## 2020-08-10 NOTE — Telephone Encounter (Signed)
Called patient to discuss recent pap results. Discussed pap smear was normal and negative for intraepithelial lesion or malignancy.  Discussed that Pap smear did show she was positive for trichomonas and chlamydia, explained that these are sexually transmitted infections, she notes that she believes she has had a history of these in the past.  Explained that if she was infected with these her partners may be infected as well and would recommend treatment for them.  Her last period was about a month ago and she is not using contraception thus cannot rule out early pregnancy.  Discussed I will send medications that could be compatible with early pregnancy.  Discussed that I can offer partner treatment to partners if I have their name and date of birth and can discuss with them that they have no known allergies to these antibiotics, she voiced understanding.  Answered all patient questions and concerns.  Recommended abstinence for 2 weeks after treating, and discussed that we recommend a test of cure at least 3 to 4 weeks after treating.  Metronidazole 500 mg twice daily for 7 days and azithromycin  1 g p.o. once sent to her pharmacy.  Burley Saver MD

## 2020-08-10 NOTE — Progress Notes (Signed)
I have sent an antibiotic called metronidazole, 500 mg twice daily for 7 days, and another antibiotic called azithromycin 1 g (4 tabs at once) taken once for 1 day to your pharmacy, as discussed these will treat the trichomonas and Chlamydia infections.

## 2020-09-02 ENCOUNTER — Other Ambulatory Visit: Payer: No Typology Code available for payment source

## 2020-09-02 DIAGNOSIS — Z20822 Contact with and (suspected) exposure to covid-19: Secondary | ICD-10-CM

## 2020-09-04 LAB — NOVEL CORONAVIRUS, NAA: SARS-CoV-2, NAA: NOT DETECTED

## 2020-09-04 LAB — SARS-COV-2, NAA 2 DAY TAT

## 2021-09-12 ENCOUNTER — Encounter: Payer: Self-pay | Admitting: Family Medicine

## 2021-09-12 ENCOUNTER — Ambulatory Visit (INDEPENDENT_AMBULATORY_CARE_PROVIDER_SITE_OTHER): Payer: BC Managed Care – PPO | Admitting: Family Medicine

## 2021-09-12 ENCOUNTER — Other Ambulatory Visit (HOSPITAL_COMMUNITY)
Admission: RE | Admit: 2021-09-12 | Discharge: 2021-09-12 | Disposition: A | Discharge status: Home / Self Care | Payer: BC Managed Care – PPO | Source: Ambulatory Visit | Attending: Family Medicine | Admitting: Family Medicine

## 2021-09-12 ENCOUNTER — Other Ambulatory Visit: Payer: Self-pay

## 2021-09-12 VITALS — BP 148/101 | HR 85 | Wt 150.0 lb

## 2021-09-12 DIAGNOSIS — N898 Other specified noninflammatory disorders of vagina: Secondary | ICD-10-CM | POA: Diagnosis not present

## 2021-09-12 DIAGNOSIS — A64 Unspecified sexually transmitted disease: Secondary | ICD-10-CM

## 2021-09-12 DIAGNOSIS — I10 Essential (primary) hypertension: Secondary | ICD-10-CM | POA: Diagnosis not present

## 2021-09-12 DIAGNOSIS — Z113 Encounter for screening for infections with a predominantly sexual mode of transmission: Secondary | ICD-10-CM

## 2021-09-12 DIAGNOSIS — Z114 Encounter for screening for human immunodeficiency virus [HIV]: Secondary | ICD-10-CM | POA: Diagnosis not present

## 2021-09-12 DIAGNOSIS — Z202 Contact with and (suspected) exposure to infections with a predominantly sexual mode of transmission: Secondary | ICD-10-CM

## 2021-09-12 LAB — POCT WET PREP (WET MOUNT)
Clue Cells Wet Prep Whiff POC: POSITIVE
Trichomonas Wet Prep HPF POC: ABSENT

## 2021-09-12 MED ORDER — METRONIDAZOLE 500 MG PO TABS: 500.0000 mg | ORAL_TABLET | Freq: Two times a day (BID) | ORAL | 0 refills | Status: DC

## 2021-09-12 MED ORDER — METOPROLOL SUCCINATE ER 50 MG PO TB24: 50.0000 mg | ORAL_TABLET | Freq: Every day | ORAL | 3 refills | Status: DC

## 2021-09-12 NOTE — Progress Notes (Signed)
° ° °  SUBJECTIVE:   CHIEF COMPLAINT / HPI:   STI exposure Patient is a 36 y.o. female presenting with recent exposure to trichomonas. Her partner told her this past week. Her LMP started 1/20, is present currently, this is a normal period for her. She is not on birth control, does not desire it at this time. She would like to be tested for STIs. She is UTD with pap smear, NILM and HPV negative in 2021.  PERTINENT  PMH / PSH: None relevant  OBJECTIVE:   BP (!) 148/101    Pulse 85    Wt 150 lb (68 kg)    LMP 09/09/2021    BMI 26.57 kg/m    General: NAD, pleasant, able to participate in exam Respiratory: Normal effort, no obvious respiratory distress Pelvic: VULVA: normal appearing vulva with no masses, tenderness or lesions, VAGINA: Normal appearing vagina with normal color, no lesions, with blood present, CERVIX: No lesions, no discharge present  ASSESSMENT/PLAN:   Sexually transmitted infection 36 y.o. female with recent exposure to trichomonas. Patient is interested in STI screening. Currently on menstrual cycle. Plan: - Treat for trichomonas with flagyl 500 mg BID x7d. -Wet prep pending. -GC/chlamydia pending -Will check HIV and RPR     Gladys Damme, MD [place]

## 2021-09-12 NOTE — Patient Instructions (Signed)
It was a pleasure to see you today!  We will get some labs today.  If they are abnormal or we need to do something about them, I will call you.  If they are normal, I will send you a message on MyChart (if it is active) or a letter in the mail.  If you don't hear from [place] in 2 weeks, please call the office  [phone]. Take flagyl 500 mg twice a day for 7 days. I recommend not drinking alcohol as this may cause a reaction with the medication that mimics a terrible hangover.    Be Well,  Dr. Leary Roca

## 2021-09-12 NOTE — Assessment & Plan Note (Addendum)
36 y.o. female with recent exposure to trichomonas. Patient is interested in STI screening. Currently on menstrual cycle. Plan: - Treat for trichomonas with flagyl 500 mg BID x7d. -Wet prep pending. -GC/chlamydia pending -Will check HIV and RPR

## 2021-09-13 LAB — CERVICOVAGINAL ANCILLARY ONLY
Chlamydia: NEGATIVE
Comment: NEGATIVE
Comment: NORMAL
Neisseria Gonorrhea: NEGATIVE

## 2021-09-13 LAB — RPR: RPR Ser Ql: NONREACTIVE

## 2021-09-13 LAB — HIV ANTIBODY (ROUTINE TESTING W REFLEX): HIV Screen 4th Generation wRfx: NONREACTIVE

## 2022-01-24 ENCOUNTER — Encounter: Payer: Self-pay | Admitting: *Deleted

## 2022-11-01 ENCOUNTER — Other Ambulatory Visit: Payer: Self-pay

## 2022-11-01 ENCOUNTER — Other Ambulatory Visit: Payer: Self-pay | Admitting: Family Medicine

## 2022-11-01 DIAGNOSIS — I10 Essential (primary) hypertension: Secondary | ICD-10-CM

## 2022-11-01 MED ORDER — METOPROLOL SUCCINATE ER 50 MG PO TB24: 50.0000 mg | ORAL_TABLET | Freq: Every day | ORAL | 3 refills | Status: DC

## 2023-05-17 ENCOUNTER — Encounter: Payer: BC Managed Care – PPO | Admitting: Family Medicine

## 2023-07-05 ENCOUNTER — Other Ambulatory Visit: Payer: Self-pay | Admitting: Student

## 2023-07-05 DIAGNOSIS — I10 Essential (primary) hypertension: Secondary | ICD-10-CM

## 2023-07-05 MED ORDER — METOPROLOL SUCCINATE ER 50 MG PO TB24: 50.0000 mg | ORAL_TABLET | Freq: Every day | ORAL | 3 refills | Status: AC
